# Patient Record
Sex: Male | Born: 1970 | Race: White | Hispanic: No | Marital: Single | Smoking: Current every day smoker
Health system: Southern US, Community
[De-identification: ages and names within clinical notes are randomized; demographics above are authoritative.]

## PROBLEM LIST (undated history)

## (undated) DIAGNOSIS — N200 Calculus of kidney: Secondary | ICD-10-CM

## (undated) DIAGNOSIS — M549 Dorsalgia, unspecified: Secondary | ICD-10-CM

## (undated) HISTORY — DX: Dorsalgia, unspecified: M54.9

## (undated) HISTORY — DX: Calculus of kidney: N20.0

---

## 2007-04-26 ENCOUNTER — Emergency Department: Payer: Self-pay | Admitting: Emergency Medicine

## 2012-07-09 ENCOUNTER — Ambulatory Visit: Payer: Self-pay | Admitting: Family Medicine

## 2013-04-24 ENCOUNTER — Ambulatory Visit: Payer: Self-pay | Admitting: Family Medicine

## 2013-05-09 ENCOUNTER — Ambulatory Visit: Payer: Self-pay | Admitting: Family Medicine

## 2013-06-04 DIAGNOSIS — D239 Other benign neoplasm of skin, unspecified: Secondary | ICD-10-CM

## 2013-06-04 HISTORY — DX: Other benign neoplasm of skin, unspecified: D23.9

## 2013-06-26 ENCOUNTER — Ambulatory Visit (INDEPENDENT_AMBULATORY_CARE_PROVIDER_SITE_OTHER): Payer: No Typology Code available for payment source | Admitting: General Surgery

## 2013-06-26 ENCOUNTER — Encounter: Payer: Self-pay | Admitting: General Surgery

## 2013-06-26 VITALS — BP 110/66 | HR 76 | Resp 12 | Ht 75.0 in | Wt 179.0 lb

## 2013-06-26 DIAGNOSIS — R220 Localized swelling, mass and lump, head: Secondary | ICD-10-CM

## 2013-06-26 DIAGNOSIS — R221 Localized swelling, mass and lump, neck: Secondary | ICD-10-CM

## 2013-06-26 NOTE — Progress Notes (Signed)
Patient ID: Paul Little, male   DOB: 1970/05/07, 43 y.o.   MRN: [id number]  Chief Complaint  Patient presents with  . Other    Cyst on neck    HPI Paul Little is a 43 y.o. male here today for an evaluation of a cyst on left posterior neck. Patient state the cyst has been there for about 20 years and is getting bigger. He states he had it drained about 5 months ago. The patient also reports that over the last 2 decades he has drain it himself with a razor. Rarely does he gets significant fluid or pus like material, usually only blood. HPI  Past Medical History  Diagnosis Date  . Back pain     No past surgical history on file.  No family history on file.  Social History History  Substance Use Topics  . Smoking status: Current Every Day Smoker -- 1.00 packs/day for 24 years    Types: Cigarettes  . Smokeless tobacco: Never Used  . Alcohol Use: Yes    No Known Allergies  Current Outpatient Prescriptions  Medication Sig Dispense Refill  . cyclobenzaprine (FLEXERIL) 10 MG tablet Take 1 tablet by mouth daily.      . nabumetone (RELAFEN) 500 MG tablet Take 1 tablet by mouth daily.       No current facility-administered medications for this visit.    Review of Systems Review of Systems  Constitutional: Negative.   Respiratory: Negative.   Cardiovascular: Negative.     Blood pressure 110/66, pulse 76, resp. rate 12, height 6\' 3"  (1.905 m), weight 179 lb (81.194 kg).  Physical Exam Physical Exam  Constitutional: He is oriented to person, place, and time. He appears well-developed and well-nourished.  Eyes: Conjunctivae are normal. No scleral icterus.  Neck: Neck supple.    Cardiovascular: Normal rate, regular rhythm and normal heart sounds.   Pulmonary/Chest: Effort normal and breath sounds normal.  Neurological: He is alert and oriented to person, place, and time.  Skin: Skin is warm and dry.       Data Reviewed Dermatology notes of Jun 04, 2013 were  reviewed.  Assessment    Chronic skin cyst left posterior neck.     Plan    The patient was amenable to excision. 10 cc of 0.5% Xylocaine with 0.25% Marcaine with 1-200,000 units of epinephrine was utilized well-tolerated. Chlor prep was applied to the skin. The ureter was excised through elliptical incision. Dense chronically inflamed tissue was noted. No purulence.  The skin defect was closed with interrupted 3-0 Vicryl subcuticular sutures. A dry dressing with Telfa and Tegaderm was applied. The procedure was well-tolerated.  He will return in one week for wound evaluation by the nursing staff.     REf. Dr. Arrie Aran. Meryl Crutch Chera Slivka 06/27/2013, 9:33 PM

## 2013-06-26 NOTE — Patient Instructions (Addendum)
Patient to return one week

## 2013-06-27 ENCOUNTER — Ambulatory Visit (INDEPENDENT_AMBULATORY_CARE_PROVIDER_SITE_OTHER): Payer: No Typology Code available for payment source | Admitting: *Deleted

## 2013-06-27 DIAGNOSIS — R221 Localized swelling, mass and lump, neck: Secondary | ICD-10-CM

## 2013-06-27 DIAGNOSIS — R220 Localized swelling, mass and lump, head: Secondary | ICD-10-CM

## 2013-06-27 NOTE — Progress Notes (Signed)
Patient came in today and saw a spot of blood on his dressing and wanted his bandage changed. Removed old bandage and applied an new one. No signs of infection noted.

## 2013-06-30 LAB — PATHOLOGY

## 2013-07-03 ENCOUNTER — Ambulatory Visit (INDEPENDENT_AMBULATORY_CARE_PROVIDER_SITE_OTHER): Payer: No Typology Code available for payment source | Admitting: *Deleted

## 2013-07-03 DIAGNOSIS — R221 Localized swelling, mass and lump, neck: Secondary | ICD-10-CM

## 2013-07-03 DIAGNOSIS — R220 Localized swelling, mass and lump, head: Secondary | ICD-10-CM

## 2013-07-03 NOTE — Patient Instructions (Signed)
The patient is aware to call back for any questions or concerns.  

## 2013-07-03 NOTE — Progress Notes (Signed)
Patient came in today for a wound check.  The left neck wound is clean, with no signs of infection noted. Aware benign pathology. Follow up as needed.

## 2017-11-26 ENCOUNTER — Encounter: Payer: Self-pay | Admitting: Emergency Medicine

## 2017-11-26 ENCOUNTER — Ambulatory Visit (INDEPENDENT_AMBULATORY_CARE_PROVIDER_SITE_OTHER): Payer: Worker's Compensation

## 2017-11-26 ENCOUNTER — Other Ambulatory Visit: Payer: Self-pay

## 2017-11-26 ENCOUNTER — Ambulatory Visit
Admission: EM | Admit: 2017-11-26 | Discharge: 2017-11-26 | Disposition: A | Discharge status: Home / Self Care | Payer: Worker's Compensation | Attending: Family Medicine | Admitting: Family Medicine

## 2017-11-26 DIAGNOSIS — S99922A Unspecified injury of left foot, initial encounter: Secondary | ICD-10-CM

## 2017-11-26 DIAGNOSIS — W230XXA Caught, crushed, jammed, or pinched between moving objects, initial encounter: Secondary | ICD-10-CM

## 2017-11-26 DIAGNOSIS — S99921A Unspecified injury of right foot, initial encounter: Secondary | ICD-10-CM

## 2017-11-26 MED ORDER — TRAMADOL HCL 50 MG PO TABS: 50.0000 mg | ORAL_TABLET | Freq: Three times a day (TID) | ORAL | 0 refills | Status: DC | PRN

## 2017-11-26 NOTE — Discharge Instructions (Signed)
Rest, ice, elevation.  Medication as needed.  Take care  Dr. Taneasha Fuqua  
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
## 2017-11-26 NOTE — ED Notes (Signed)
Obtained UDS without issue. [facility]

## 2018-12-11 ENCOUNTER — Ambulatory Visit
Admission: EM | Admit: 2018-12-11 | Discharge: 2018-12-11 | Disposition: A | Discharge status: Home / Self Care | Payer: 59

## 2018-12-11 ENCOUNTER — Encounter: Payer: Self-pay | Admitting: Emergency Medicine

## 2018-12-11 ENCOUNTER — Other Ambulatory Visit: Payer: Self-pay

## 2018-12-11 DIAGNOSIS — M79671 Pain in right foot: Secondary | ICD-10-CM | POA: Diagnosis not present

## 2018-12-11 DIAGNOSIS — M79672 Pain in left foot: Secondary | ICD-10-CM | POA: Diagnosis not present

## 2018-12-11 MED ORDER — MELOXICAM 15 MG PO TABS: 15.0000 mg | ORAL_TABLET | Freq: Every day | ORAL | 0 refills | Status: AC
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
## 2018-12-11 NOTE — Discharge Instructions (Signed)
Take meds as directed. Apply ACE wrap to both feet daily. Follow-up with ortho.

## 2019-06-29 IMAGING — CR DG FOOT COMPLETE 3+V*R*
4 series · 4 of 4 positions shown · non-contrast
Comparison: None.

CLINICAL DATA: Initial evaluation for acute pain status post crush
injury.

EXAM:
RIGHT FOOT COMPLETE - 3+ VIEW

[foot ap]
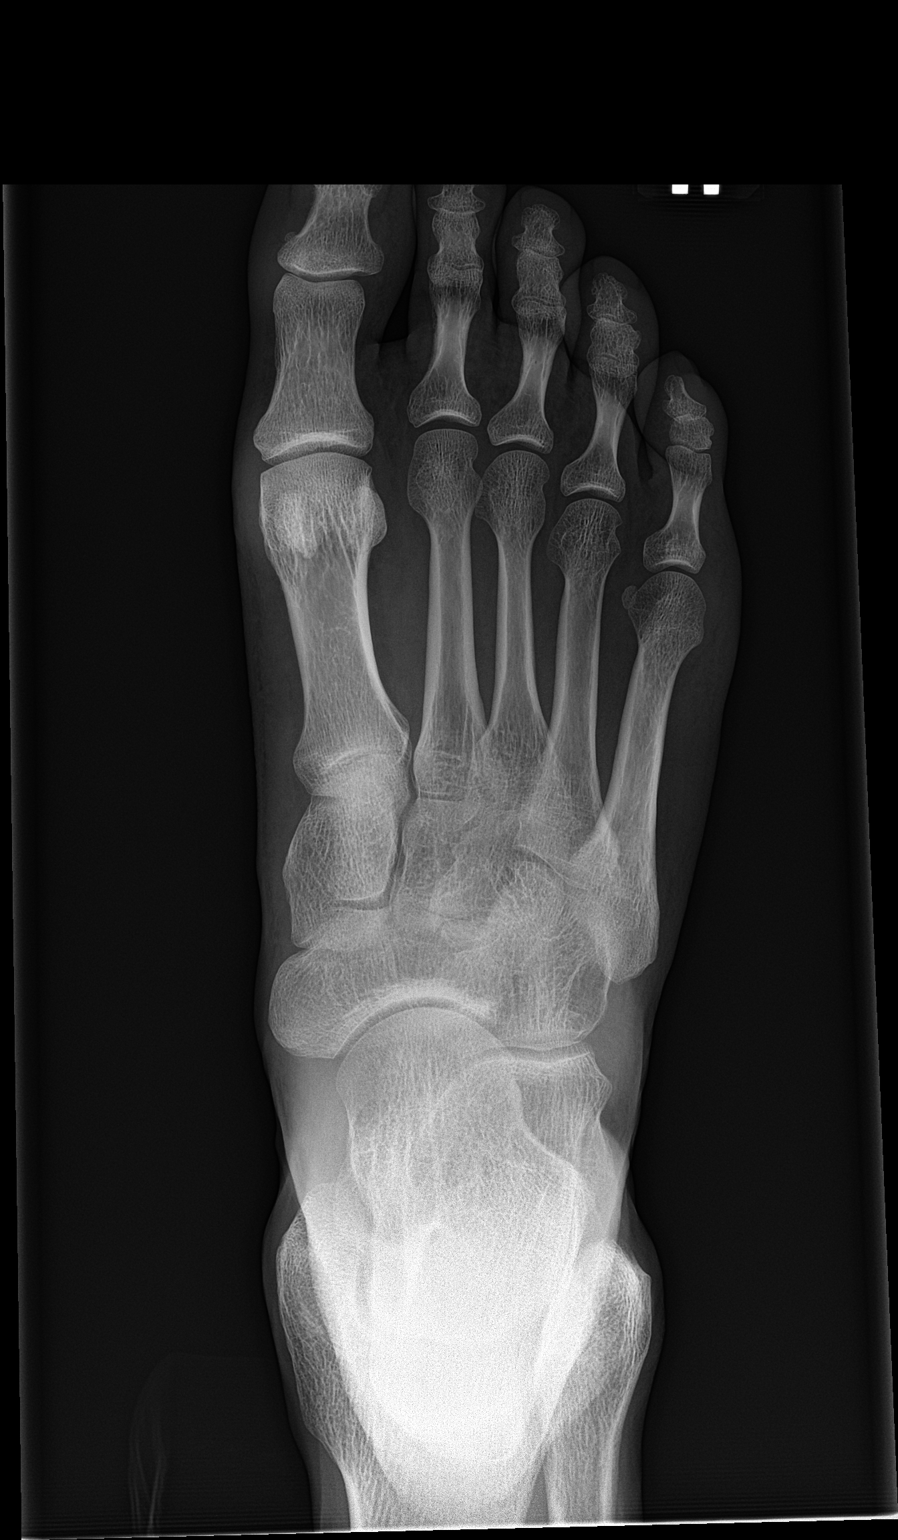

[foot obl]
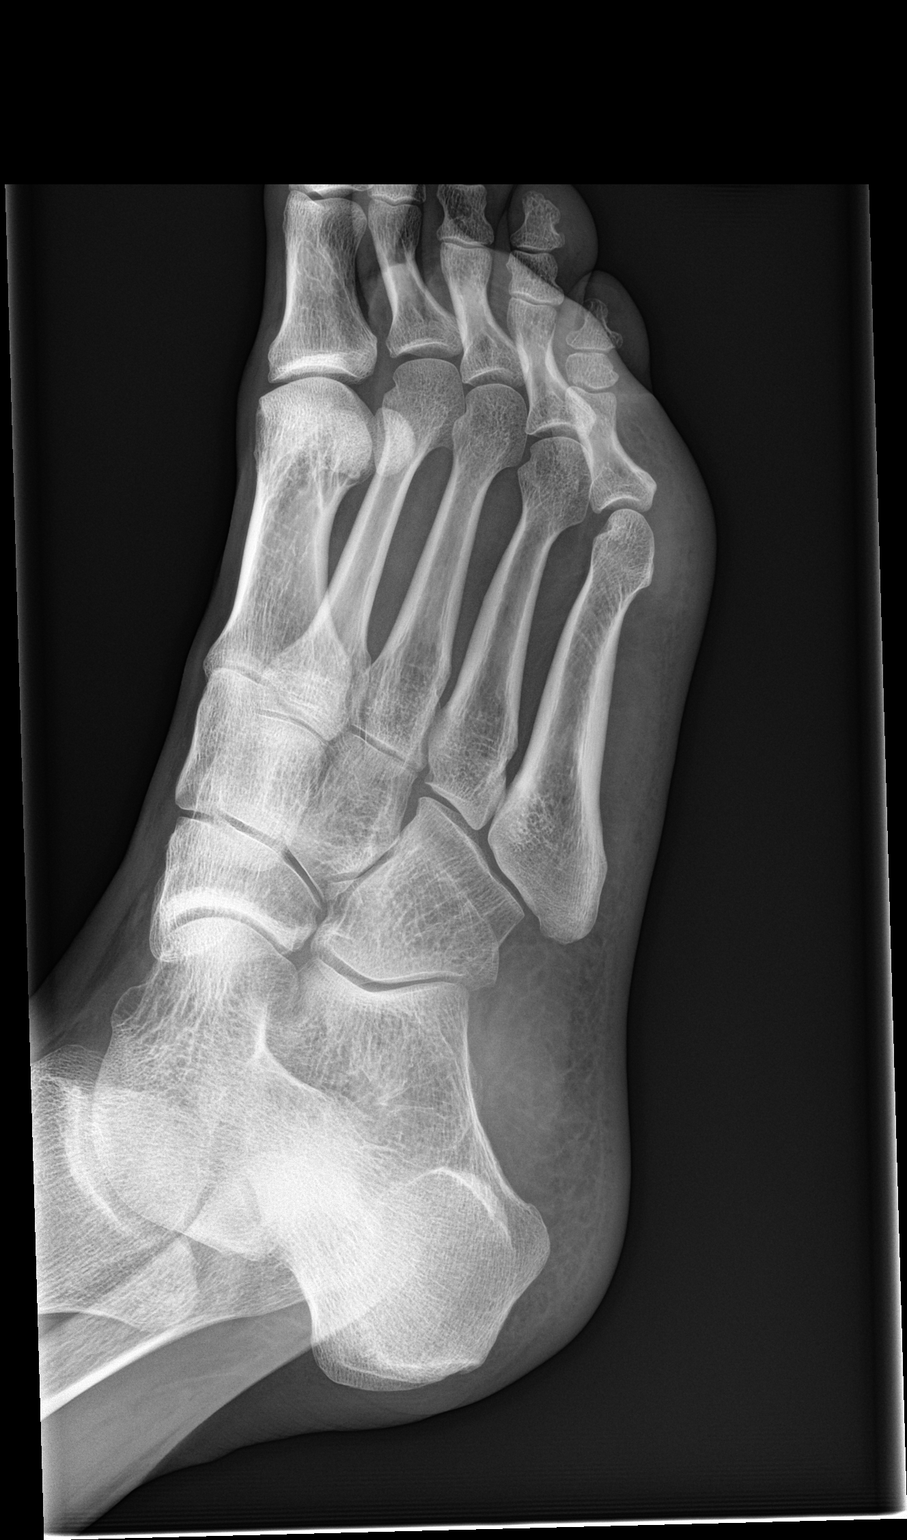

[foot lat (1 of 2)]
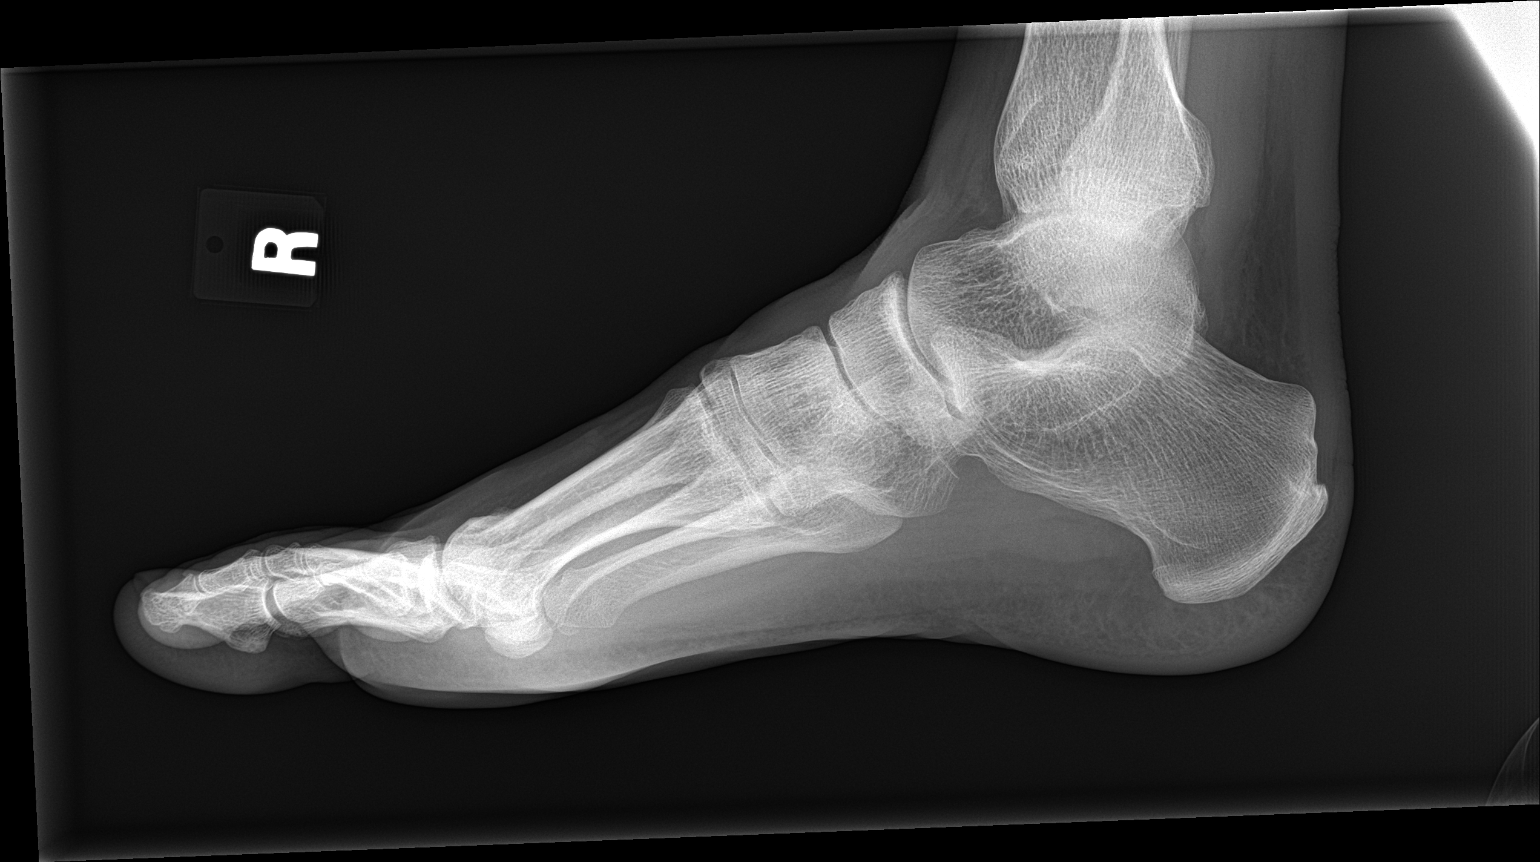

[foot lat (2 of 2)]
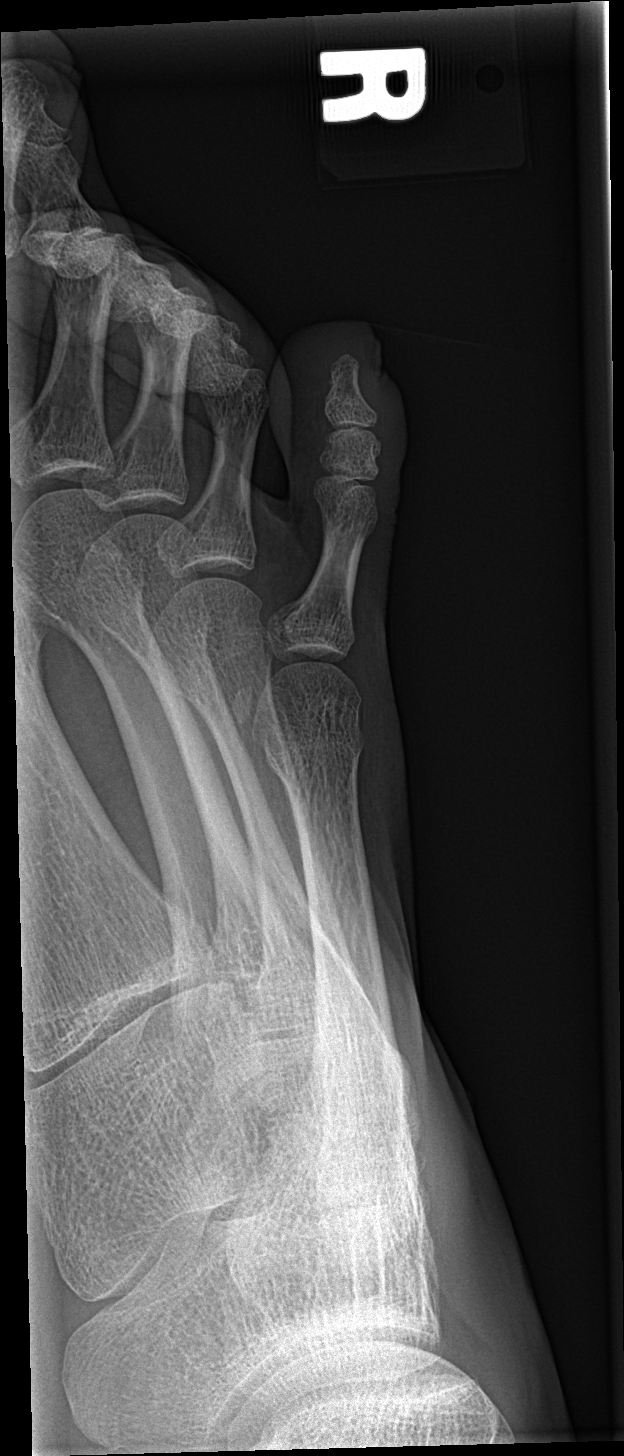

[4 of 4 positions shown; findings below may reference images not displayed]

FINDINGS: There is no evidence of fracture or dislocation. There is no
evidence of arthropathy or other focal bone abnormality. Soft
tissues are unremarkable.
IMPRESSION: No acute osseous abnormality about the right foot.

## 2019-09-05 ENCOUNTER — Ambulatory Visit: Payer: Self-pay | Admitting: Family Medicine

## 2019-10-24 ENCOUNTER — Ambulatory Visit: Payer: Self-pay | Admitting: Family Medicine

## 2019-10-28 ENCOUNTER — Encounter: Payer: Self-pay | Admitting: Family Medicine

## 2019-10-28 ENCOUNTER — Ambulatory Visit (INDEPENDENT_AMBULATORY_CARE_PROVIDER_SITE_OTHER): Payer: BC Managed Care – PPO | Admitting: Family Medicine

## 2019-10-28 ENCOUNTER — Other Ambulatory Visit: Payer: Self-pay

## 2019-10-28 VITALS — BP 100/65 | HR 77 | Temp 98.2°F | Resp 16 | Ht 76.0 in | Wt 187.0 lb

## 2019-10-28 DIAGNOSIS — R1013 Epigastric pain: Secondary | ICD-10-CM | POA: Diagnosis not present

## 2019-10-28 DIAGNOSIS — R1084 Generalized abdominal pain: Secondary | ICD-10-CM

## 2019-10-28 DIAGNOSIS — R5383 Other fatigue: Secondary | ICD-10-CM | POA: Diagnosis not present

## 2019-10-28 MED ORDER — OMEPRAZOLE 40 MG PO CPDR: 40.0000 mg | DELAYED_RELEASE_CAPSULE | Freq: Every day | ORAL | 1 refills | Status: DC

## 2019-10-28 MED ORDER — SUCRALFATE 1 G PO TABS: 1.0000 g | ORAL_TABLET | Freq: Three times a day (TID) | ORAL | 1 refills | Status: DC
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
## 2019-10-28 NOTE — Patient Instructions (Addendum)
Thank you for coming to the office today.  Blood work today stay tuned for results, may show cause of your symptoms  Stay tuned for apt from [facility]) [address], [place] [address] Phone: [phone]  I am not sure the exact cause of your abdominal pain, however I am concerned that one significant possibility could be uncontrolled Acid Reflux (GERD) and may have developed an Ulcer (Peptic Ulcer of stomach). May be from anti inflammatory motrin  Starting tomorrow before breakfast take Omeprazole rx 40mg  daily. Prefer to take this med about 30 min before breakfast or 1st meal of day for 4 weeks, don't stop taking unless we discuss first. Probably will need for about 8 weeks total if this ends up being an Ulcer.  Take other prescribed medicine Carafate (Sucralfate) as needed up to 4 times daily (3 meals and bedtime)  to coat stomach lining to ease symptoms, if it helps reduce symptoms then it is more likely to be due to acid and/or ulcer.  DIET RECOMMENDATIONS - Avoid spicy, greasy, fried foods, also things like caffeine, dark chocolate, peppermint can worsen - Avoid large meals and late night snacks, also do not go more than 4-5 hours without a snack or meal (not eating will worsen reflux symptoms due to stomach acid) - You may also elevate the head of your bed at night to sleep at very slight incline to help reduce symptoms  If the problem improves but keeps coming back, we can discuss higher dose or longer course at next visit.  If symptoms are worsening or persistent despite treatment or develop any different severe esophagus or abdominal pain, unable to swallow solids or liquids, nausea, vomiting especially blood in vomit, fever/chills, or unintentional weight loss / no appetite, please follow-up sooner in office or seek more immediate medical attention at hospital Emergency Department.  Regarding other  medicines:  - STOP taking Ibuprofen, Advil, Motrin, Goody's / BC powder - DO NOT take without discussing with your doctor. These medicines can put you at high risk for future bleeding.  If need pain medicine, may take Tylenol Extra Strength (Acetaminophen) 500mg  tabs - take 1 to 2 tabs per dose (max 1000mg ) every 6-8 hours for pain (take regularly, don't skip a dose for next 7 days), max 24 hour daily dose is 6 tablets or 3000mg . In the future you can repeat the same everyday Tylenol course for 1-2 weeks at a time.   Please schedule a Follow-up Appointment to: Return in about 6 weeks (around 12/09/2019) for 4-6 week follow-up abdominal pain / GI follow-up.  If you have any other questions or concerns, please feel free to call the office or send a message through [facility]. You may also schedule an earlier appointment if necessary.  Additionally, you may be receiving a survey about your experience at our office within a few days to 1 week by e-mail or mail. We value your feedback.  Nobie Putnam, DO [facility]

## 2019-10-29 ENCOUNTER — Encounter: Payer: Self-pay | Admitting: Family Medicine

## 2019-10-29 LAB — COMPLETE METABOLIC PANEL WITH GFR
AG Ratio: 1.7 (calc) (ref 1.0–2.5)
ALT: 13 U/L (ref 9–46)
AST: 11 U/L (ref 10–40)
Albumin: 4.4 g/dL (ref 3.6–5.1)
Alkaline phosphatase (APISO): 84 U/L (ref 36–130)
BUN: 10 mg/dL (ref 7–25)
CO2: 27 mmol/L (ref 20–32)
Calcium: 9.3 mg/dL (ref 8.6–10.3)
Chloride: 106 mmol/L (ref 98–110)
Creat: 0.91 mg/dL (ref 0.60–1.35)
GFR, Est African American: 115 mL/min/{1.73_m2} (ref >=60)
GFR, Est Non African American: 99 mL/min/{1.73_m2} (ref >=60)
Globulin: 2.6 g/dL (calc) (ref 1.9–3.7)
Glucose, Bld: 94 mg/dL (ref 65–99)
Potassium: 3.9 mmol/L (ref 3.5–5.3)
Sodium: 139 mmol/L (ref 135–146)
Total Bilirubin: 0.6 mg/dL (ref 0.2–1.2)
Total Protein: 7 g/dL (ref 6.1–8.1)

## 2019-10-29 LAB — CBC WITH DIFFERENTIAL/PLATELET
Absolute Monocytes: 764 cells/uL (ref 200–950)
Basophils Absolute: 92 cells/uL (ref 0–200)
Basophils Relative: 1 %
Eosinophils Absolute: 304 cells/uL (ref 15–500)
Eosinophils Relative: 3.3 %
HCT: 46.5 % (ref 38.5–50.0)
Hemoglobin: 15.8 g/dL (ref 13.2–17.1)
Lymphs Abs: 4131 cells/uL — ABNORMAL HIGH (ref 850–3900)
MCH: 28.3 pg (ref 27.0–33.0)
MCHC: 34 g/dL (ref 32.0–36.0)
MCV: 83.2 fL (ref 80.0–100.0)
MPV: 11 fL (ref 7.5–12.5)
Monocytes Relative: 8.3 %
Neutro Abs: 3910 cells/uL (ref 1500–7800)
Neutrophils Relative %: 42.5 %
Platelets: 303 10*3/uL (ref 140–400)
RBC: 5.59 10*6/uL (ref 4.20–5.80)
RDW: 13.1 % (ref 11.0–15.0)
Total Lymphocyte: 44.9 %
WBC: 9.2 10*3/uL (ref 3.8–10.8)

## 2019-11-06 ENCOUNTER — Other Ambulatory Visit: Payer: Self-pay | Admitting: Family Medicine

## 2019-11-06 DIAGNOSIS — R1013 Epigastric pain: Secondary | ICD-10-CM

## 2019-11-06 DIAGNOSIS — R1084 Generalized abdominal pain: Secondary | ICD-10-CM

## 2019-12-09 ENCOUNTER — Ambulatory Visit: Payer: Medicaid Other | Admitting: Family Medicine

## 2019-12-15 ENCOUNTER — Ambulatory Visit: Payer: 59 | Admitting: Gastroenterology

## 2020-09-02 ENCOUNTER — Telehealth: Payer: Self-pay

## 2020-09-02 NOTE — Telephone Encounter (Signed)
Opened in error

## 2021-07-11 DIAGNOSIS — H5213 Myopia, bilateral: Secondary | ICD-10-CM | POA: Diagnosis not present

## 2021-10-07 NOTE — Progress Notes (Unsigned)
There were no vitals taken for this visit.   Subjective:    Patient ID: Paul Little, male    DOB: [birth year], 51 y.o.   MRN: [id number]  HPI: Paul Little is a 51 y.o. male  No chief complaint on file.  Patient presents to clinic to establish care with new PCP.  Introduced to Designer, jewellery role and practice setting.  All questions answered.  Discussed provider/patient relationship and expectations.  Patient reports a history of ***. Patient denies a history of: Hypertension, Elevated Cholesterol, Diabetes, Thyroid problems, Depression, Anxiety, Neurological problems, and Abdominal problems.   Active Ambulatory Problems    Diagnosis Date Noted   Neck mass 06/27/2013   Resolved Ambulatory Problems    Diagnosis Date Noted   No Resolved Ambulatory Problems   Past Medical History:  Diagnosis Date   Back pain    Kidney stone    No past surgical history on file. Family History  Problem Relation Age of Onset   Kidney cancer Mother    Basal cell carcinoma Father    Hyperlipidemia Father      Review of Systems  Per HPI unless specifically indicated above     Objective:    There were no vitals taken for this visit.  Wt Readings from Last 3 Encounters:  10/28/19 187 lb (84.8 kg)  11/26/17 183 lb (83 kg)  06/26/13 179 lb (81.2 kg)    Physical Exam  Results for orders placed or performed in visit on 10/28/19  COMPLETE METABOLIC PANEL WITH GFR  Result Value Ref Range   Glucose, Bld 94 65 - 99 mg/dL   BUN 10 7 - 25 mg/dL   Creat 0.91 0.60 - 1.35 mg/dL   GFR, Est Non African American 99 > OR = 60 mL/min/1.77m   GFR, Est African American 115 > OR = 60 mL/min/1.731m  BUN/Creatinine Ratio NOT APPLICABLE 6 - 22 (calc)   Sodium 139 135 - 146 mmol/L   Potassium 3.9 3.5 - 5.3 mmol/L   Chloride 106 98 - 110 mmol/L   CO2 27 20 - 32 mmol/L   Calcium 9.3 8.6 - 10.3 mg/dL   Total Protein 7.0 6.1 - 8.1 g/dL   Albumin 4.4 3.6 - 5.1 g/dL   Globulin 2.6 1.9 - 3.7  g/dL (calc)   AG Ratio 1.7 1.0 - 2.5 (calc)   Total Bilirubin 0.6 0.2 - 1.2 mg/dL   Alkaline phosphatase (APISO) 84 36 - 130 U/L   AST 11 10 - 40 U/L   ALT 13 9 - 46 U/L  CBC with Differential/Platelet  Result Value Ref Range   WBC 9.2 3.8 - 10.8 Thousand/uL   RBC 5.59 4.20 - 5.80 Million/uL   Hemoglobin 15.8 13.2 - 17.1 g/dL   HCT 46.5 38.5 - 50.0 %   MCV 83.2 80.0 - 100.0 fL   MCH 28.3 27.0 - 33.0 pg   MCHC 34.0 32.0 - 36.0 g/dL   RDW 13.1 11.0 - 15.0 %   Platelets 303 140 - 400 Thousand/uL   MPV 11.0 7.5 - 12.5 fL   Neutro Abs 3,910 1,500 - 7,800 cells/uL   Lymphs Abs 4,131 (H) 850 - 3,900 cells/uL   Absolute Monocytes 764 200 - 950 cells/uL   Eosinophils Absolute 304 15 - 500 cells/uL   Basophils Absolute 92 0 - 200 cells/uL   Neutrophils Relative % 42.5 %   Total Lymphocyte 44.9 %   Monocytes Relative 8.3 %   Eosinophils Relative 3.3 %  Basophils Relative 1.0 %      Assessment & Plan:   Problem List Items Addressed This Visit   None Visit Diagnoses     Encounter to establish care    -  Primary        Follow up plan: No follow-ups on file.

## 2021-10-10 ENCOUNTER — Encounter: Payer: Self-pay | Admitting: Nurse Practitioner

## 2021-10-10 ENCOUNTER — Ambulatory Visit: Payer: Medicaid Other | Admitting: Nurse Practitioner

## 2021-10-10 VITALS — BP 128/91 | HR 90 | Temp 98.4°F | Ht 76.0 in | Wt 192.3 lb

## 2021-10-10 DIAGNOSIS — Z23 Encounter for immunization: Secondary | ICD-10-CM | POA: Diagnosis not present

## 2021-10-10 DIAGNOSIS — Z7689 Persons encountering health services in other specified circumstances: Secondary | ICD-10-CM

## 2021-10-10 DIAGNOSIS — D229 Melanocytic nevi, unspecified: Secondary | ICD-10-CM | POA: Diagnosis not present

## 2021-10-10 DIAGNOSIS — R6882 Decreased libido: Secondary | ICD-10-CM

## 2021-10-10 DIAGNOSIS — Z1331 Encounter for screening for depression: Secondary | ICD-10-CM

## 2021-10-10 DIAGNOSIS — H9313 Tinnitus, bilateral: Secondary | ICD-10-CM

## 2021-10-17 ENCOUNTER — Telehealth: Payer: Self-pay | Admitting: Nurse Practitioner

## 2021-10-17 DIAGNOSIS — H9313 Tinnitus, bilateral: Secondary | ICD-10-CM | POA: Diagnosis not present

## 2021-10-17 DIAGNOSIS — H903 Sensorineural hearing loss, bilateral: Secondary | ICD-10-CM | POA: Diagnosis not present

## 2021-10-17 NOTE — Telephone Encounter (Signed)
Pt had lab test done on 9/18 is anxious for the results, not seen, pls fu with pt re results @ [phone]

## 2021-10-18 LAB — TESTOSTERONE, FREE, TOTAL, SHBG
Sex Hormone Binding: 69.5 nmol/L (ref 19.3–76.4)
Testosterone, Free: 5.9 pg/mL — ABNORMAL LOW (ref 7.2–24.0)
Testosterone: 605 ng/dL (ref 264–916)

## 2021-10-18 NOTE — Progress Notes (Signed)
Referral placed.

## 2021-10-18 NOTE — Addendum Note (Signed)
Addended by: Jon Billings on: 10/18/2021 08:50 AM   Modules accepted: Orders

## 2021-10-18 NOTE — Progress Notes (Signed)
Please let patient know that his some of his free testosterone is slightly low but his total testosterone is within normal limits. If he would like to discuss testosterone replacement, I recommend he see Urology for this. I can place the referral if he would like.

## 2021-10-18 NOTE — Telephone Encounter (Signed)
Spoke with patient regarding lab results, no further questions or concerns.

## 2021-11-10 NOTE — Progress Notes (Signed)
BP 120/79   Pulse 86   Temp 98.4 F (36.9 C) (Oral)   Ht 6' 2.8" (1.9 m)   Wt 190 lb 9.6 oz (86.5 kg)   SpO2 98%   BMI 23.95 kg/m    Subjective:    Patient ID: Paul Little, male    DOB: 11-24-1970, 51 y.o.   MRN: [id number]  HPI: Paul Little is a 51 y.o. male presenting on 11/11/2021 for comprehensive medical examination. Current medical complaints include:none  He currently lives with: Interim Problems from his last visit: no  Depression Screen done today and results listed below:     10/10/2021    9:13 AM 10/28/2019    3:51 PM  Depression screen PHQ 2/9  Decreased Interest 2 0  Down, Depressed, Hopeless 2 0  PHQ - 2 Score 4 0  Altered sleeping 0   Tired, decreased energy 1   Change in appetite 1   Feeling bad or failure about yourself  2   Trouble concentrating 2   Moving slowly or fidgety/restless 0   Suicidal thoughts 0   PHQ-9 Score 10   Difficult doing work/chores Somewhat difficult     The patient does not have a history of falls. I did complete a risk assessment for falls. A plan of care for falls was documented.   Past Medical History:  Past Medical History:  Diagnosis Date   Back pain    Kidney stone     Surgical History:  History reviewed. No pertinent surgical history.  Medications:  No current outpatient medications on file prior to visit.   No current facility-administered medications on file prior to visit.    Allergies:  No Known Allergies  Social History:  Social History   Socioeconomic History   Marital status: Single    Spouse name: Not on file   Number of children: Not on file   Years of education: Not on file   Highest education level: Not on file  Occupational History   Not on file  Tobacco Use   Smoking status: Every Day    Packs/day: 1.00    Years: 29.00    Total pack years: 29.00    Types: Cigarettes   Smokeless tobacco: Never  Vaping Use   Vaping Use: Never used  Substance and Sexual Activity    Alcohol use: Yes    Comment: occassionally   Drug use: No   Sexual activity: Yes  Other Topics Concern   Not on file  Social History Narrative   Not on file   Social Determinants of Health   Financial Resource Strain: Not on file  Food Insecurity: Not on file  Transportation Needs: Not on file  Physical Activity: Not on file  Stress: Not on file  Social Connections: Not on file  Intimate Partner Violence: Not on file   Social History   Tobacco Use  Smoking Status Every Day   Packs/day: 1.00   Years: 29.00   Total pack years: 29.00   Types: Cigarettes  Smokeless Tobacco Never   Social History   Substance and Sexual Activity  Alcohol Use Yes   Comment: occassionally    Family History:  Family History  Problem Relation Age of Onset   Kidney cancer Mother    Basal cell carcinoma Father    Hyperlipidemia Father     Past medical history, surgical history, medications, allergies, family history and social history reviewed with patient today and changes made to appropriate areas of the  chart.   Review of Systems  All other systems reviewed and are negative.  All other ROS negative except what is listed above and in the HPI.      Objective:    BP 120/79   Pulse 86   Temp 98.4 F (36.9 C) (Oral)   Ht 6' 2.8" (1.9 m)   Wt 190 lb 9.6 oz (86.5 kg)   SpO2 98%   BMI 23.95 kg/m   Wt Readings from Last 3 Encounters:  11/11/21 190 lb 9.6 oz (86.5 kg)  10/10/21 192 lb 4.8 oz (87.2 kg)  10/28/19 187 lb (84.8 kg)    Physical Exam Vitals and nursing note reviewed.  Constitutional:      General: He is not in acute distress.    Appearance: Normal appearance. He is normal weight. He is not ill-appearing, toxic-appearing or diaphoretic.  HENT:     Head: Normocephalic.     Right Ear: Tympanic membrane, ear canal and external ear normal.     Left Ear: Tympanic membrane, ear canal and external ear normal.     Nose: Nose normal. No congestion or rhinorrhea.      Mouth/Throat:     Mouth: Mucous membranes are moist.  Eyes:     General:        Right eye: No discharge.        Left eye: No discharge.     Extraocular Movements: Extraocular movements intact.     Conjunctiva/sclera: Conjunctivae normal.     Pupils: Pupils are equal, round, and reactive to light.  Cardiovascular:     Rate and Rhythm: Normal rate and regular rhythm.     Heart sounds: No murmur heard. Pulmonary:     Effort: Pulmonary effort is normal. No respiratory distress.     Breath sounds: Normal breath sounds. No wheezing, rhonchi or rales.  Abdominal:     General: Abdomen is flat. Bowel sounds are normal. There is no distension.     Palpations: Abdomen is soft.     Tenderness: There is no abdominal tenderness. There is no guarding.  Musculoskeletal:     Cervical back: Normal range of motion and neck supple.  Skin:    General: Skin is warm and dry.     Capillary Refill: Capillary refill takes less than 2 seconds.  Neurological:     General: No focal deficit present.     Mental Status: He is alert and oriented to person, place, and time.     Cranial Nerves: No cranial nerve deficit.     Motor: No weakness.     Deep Tendon Reflexes: Reflexes normal.  Psychiatric:        Mood and Affect: Mood normal.        Behavior: Behavior normal.        Thought Content: Thought content normal.        Judgment: Judgment normal.     Results for orders placed or performed in visit on 10/10/21  Testosterone, free, total([facility])  Result Value Ref Range   Testosterone 605 264 - 916 ng/dL   Testosterone, Free 5.9 (L) 7.2 - 24.0 pg/mL   Sex Hormone Binding 69.5 19.3 - 76.4 nmol/L      Assessment & Plan:   Problem List Items Addressed This Visit   None Visit Diagnoses     Annual physical exam    -  Primary   Health maintenance reviewed during visit today.  Labs ordered. TDAP up to date.  Declined Colonoscopy. Declined pneumonia  vaccine.   Relevant Orders   TSH   PSA    Lipid panel   CBC with Differential/Platelet   Comprehensive metabolic panel   Urinalysis, Routine w reflex microscopic   Hepatitis C Antibody   HIV Antibody (routine testing w rflx)   Positive depression screening       Feels like it is everyday life situations.  Declines needing medication.   Relevant Orders   HIV Antibody (routine testing w rflx)   Current every day smoker       Low dose CT Lung ordered. Declined pneumonia shot.   Relevant Orders   CT CHEST LUNG CA SCREEN LOW DOSE W/O CM   Screening for ischemic heart disease       Relevant Orders   Lipid panel   Screening for HIV (human immunodeficiency virus)       Encounter for hepatitis C screening test for low risk patient       Relevant Orders   Hepatitis C Antibody        Discussed aspirin prophylaxis for myocardial infarction prevention and decision was it was not indicated  LABORATORY TESTING:  Health maintenance labs ordered today as discussed above.   The natural history of prostate cancer and ongoing controversy regarding screening and potential treatment outcomes of prostate cancer has been discussed with the patient. The meaning of a false positive PSA and a false negative PSA has been discussed. He indicates understanding of the limitations of this screening test and wishes to proceed with screening PSA testing.   IMMUNIZATIONS:   - Tdap: Tetanus vaccination status reviewed: last tetanus booster within 10 years. - Influenza: Refused - Pneumovax: Refused - Prevnar: Not applicable - COVID: Not applicable - HPV: Not applicable - Shingrix vaccine: Refused  SCREENING: - Colonoscopy: Refused  Discussed with patient purpose of the colonoscopy is to detect colon cancer at curable precancerous or early stages   - AAA Screening: Not applicable  -Hearing Test: Not applicable  -Spirometry: Not applicable   PATIENT COUNSELING:    Sexuality: Discussed sexually transmitted diseases, partner selection, use of  condoms, avoidance of unintended pregnancy  and contraceptive alternatives.   Advised to avoid cigarette smoking.  I discussed with the patient that most people either abstain from alcohol or drink within safe limits (<=14/week and <=4 drinks/occasion for males, <=7/weeks and <= 3 drinks/occasion for females) and that the risk for alcohol disorders and other health effects rises proportionally with the number of drinks per week and how often a drinker exceeds daily limits.  Discussed cessation/primary prevention of drug use and availability of treatment for abuse.   Diet: Encouraged to adjust caloric intake to maintain  or achieve ideal body weight, to reduce intake of dietary saturated fat and total fat, to limit sodium intake by avoiding high sodium foods and not adding table salt, and to maintain adequate dietary potassium and calcium preferably from fresh fruits, vegetables, and low-fat dairy products.    stressed the importance of regular exercise  Injury prevention: Discussed safety belts, safety helmets, smoke detector, smoking near bedding or upholstery.   Dental health: Discussed importance of regular tooth brushing, flossing, and dental visits.   Follow up plan: NEXT PREVENTATIVE PHYSICAL DUE IN 1 YEAR. Return in about 1 year (around 11/12/2022) for Physical and Fasting labs.

## 2021-11-11 ENCOUNTER — Encounter: Payer: Self-pay | Admitting: Nurse Practitioner

## 2021-11-11 ENCOUNTER — Ambulatory Visit (INDEPENDENT_AMBULATORY_CARE_PROVIDER_SITE_OTHER): Payer: Medicaid Other | Admitting: Nurse Practitioner

## 2021-11-11 ENCOUNTER — Ambulatory Visit (INDEPENDENT_AMBULATORY_CARE_PROVIDER_SITE_OTHER): Payer: Medicaid Other | Admitting: Urology

## 2021-11-11 ENCOUNTER — Encounter: Payer: Self-pay | Admitting: Urology

## 2021-11-11 VITALS — BP 128/69 | HR 72 | Ht 74.0 in | Wt 190.0 lb

## 2021-11-11 VITALS — BP 120/79 | HR 86 | Temp 98.4°F | Ht 74.8 in | Wt 190.6 lb

## 2021-11-11 DIAGNOSIS — F17200 Nicotine dependence, unspecified, uncomplicated: Secondary | ICD-10-CM

## 2021-11-11 DIAGNOSIS — Z136 Encounter for screening for cardiovascular disorders: Secondary | ICD-10-CM

## 2021-11-11 DIAGNOSIS — Z0000 Encounter for general adult medical examination without abnormal findings: Secondary | ICD-10-CM | POA: Diagnosis not present

## 2021-11-11 DIAGNOSIS — Z1159 Encounter for screening for other viral diseases: Secondary | ICD-10-CM

## 2021-11-11 DIAGNOSIS — R7989 Other specified abnormal findings of blood chemistry: Secondary | ICD-10-CM

## 2021-11-11 DIAGNOSIS — Z1331 Encounter for screening for depression: Secondary | ICD-10-CM

## 2021-11-11 DIAGNOSIS — R5383 Other fatigue: Secondary | ICD-10-CM | POA: Diagnosis not present

## 2021-11-11 DIAGNOSIS — Z114 Encounter for screening for human immunodeficiency virus [HIV]: Secondary | ICD-10-CM

## 2021-11-11 DIAGNOSIS — R6882 Decreased libido: Secondary | ICD-10-CM | POA: Diagnosis not present

## 2021-11-11 LAB — URINALYSIS, ROUTINE W REFLEX MICROSCOPIC
Bilirubin, UA: NEGATIVE
Glucose, UA: NEGATIVE
Ketones, UA: NEGATIVE
Leukocytes,UA: NEGATIVE
Nitrite, UA: NEGATIVE
Protein,UA: NEGATIVE
Specific Gravity, UA: 1.02 (ref 1.005–1.030)
Urobilinogen, Ur: 0.2 mg/dL (ref 0.2–1.0)
pH, UA: 7 (ref 5.0–7.5)

## 2021-11-11 LAB — MICROSCOPIC EXAMINATION
Bacteria, UA: NONE SEEN
Epithelial Cells (non renal): NONE SEEN /hpf (ref 0–10)
WBC, UA: NONE SEEN /hpf (ref 0–5)

## 2021-11-11 NOTE — Progress Notes (Unsigned)
   11/11/2021 1:14 PM   Paul Little Aug 21, 1970 [id number]  Referring provider: Jon Billings, NP [address] [place],  [place] [address]  Chief Complaint  Patient presents with   Other    HPI: ELIYOHU CLASS is a 51 y.o. male referred for evaluation of low libido.  PCP visit 10/10/2021 with complaints of fatigue and low libido A total testosterone level was normal at 605; free testosterone level was low at 5.9 pg/mL No bothersome LUTS Denies ED   PMH: Past Medical History:  Diagnosis Date   Back pain    Kidney stone     Surgical History: No past surgical history on file.  Home Medications:  Allergies as of 11/11/2021   No Known Allergies      Medication List    as of November 11, 2021  1:14 PM   You have not been prescribed any medications.     Allergies: No Known Allergies  Family History: Family History  Problem Relation Age of Onset   Kidney cancer Mother    Basal cell carcinoma Father    Hyperlipidemia Father     Social History:  reports that he has been smoking cigarettes. He has a 29.00 pack-year smoking history. He has never used smokeless tobacco. He reports current alcohol use. He reports that he does not use drugs.   Physical Exam: BP 128/69   Pulse 72   Ht '6\' 2"'$  (1.88 m)   Wt 190 lb (86.2 kg)   BMI 24.39 kg/m   Constitutional:  Alert and oriented, No acute distress. HEENT: [place] AT, moist mucus membranes.  Trachea midline, no masses. Cardiovascular: No clubbing, cyanosis, or edema. Respiratory: Normal respiratory effort, no increased work of breathing. GI: Abdomen is soft, nontender, nondistended, no abdominal masses GU: Phallus without lesions.  Testes descended bilaterally without masses or tenderness and volume >20 cc bilaterally Skin: No rashes, bruises or suspicious lesions. Neurologic: Grossly intact, no focal deficits, moving all 4 extremities. Psychiatric: Normal mood and affect.   Assessment & Plan:   51 y.o. male  with low energy and libido.  Total testosterone level in normal range however free testosterone level is low Schedule repeat a.m. free/total testosterone and LH We discussed the diagnosis of testosterone is based on 2 abnormal total testosterone levels drawn in the a.m. admitted with signs and symptoms of low testosterone. We discussed various forms of testosterone placement including topical preparations, intramuscular injections, subcutaneous injections, subcutaneous pellet implantation and oral testosterone.  Pros and cons of each form were discussed.  The risk of transference of topical testosterone. I had an extensive discussion regarding testosterone replacement therapy including the following: Treatment may result in improvements in erectile function, low sex drive, anemia, bone mineral density, lean body mass, and depressive symptoms; evidence is inconclusive whether testosterone therapy improves cognitive function, measures of diabetes, energy, fatigue, lipid profiles, and quality of life measures; there is no conclusive evidence linking testosterone therapy to the development of prostate cancer; there is no definitive evidence linking testosterone therapy to a higher incidence of venothrombolic events; at the present time it cannot be stated definitively whether testosterone therapy increases or decreases the risk of cardiovascular events including myocardial infarction and stroke. Potential side effects were discussed including erythrocytosis, gynecomastia.  The need for regular monitoring of testosterone levels and hematocrit was discussed.    Abbie Sons, [place] [address], [place], [place] [address] [phone]

## 2021-11-12 LAB — CBC WITH DIFFERENTIAL/PLATELET
Basophils Absolute: 0.1 10*3/uL (ref 0.0–0.2)
Basos: 1 %
EOS (ABSOLUTE): 0.2 10*3/uL (ref 0.0–0.4)
Eos: 2 %
Hematocrit: 50.9 % (ref 37.5–51.0)
Hemoglobin: 17 g/dL (ref 13.0–17.7)
Immature Grans (Abs): 0 10*3/uL (ref 0.0–0.1)
Immature Granulocytes: 0 %
Lymphocytes Absolute: 3.3 10*3/uL — ABNORMAL HIGH (ref 0.7–3.1)
Lymphs: 35 %
MCH: 28.2 pg (ref 26.6–33.0)
MCHC: 33.4 g/dL (ref 31.5–35.7)
MCV: 85 fL (ref 79–97)
Monocytes Absolute: 0.8 10*3/uL (ref 0.1–0.9)
Monocytes: 8 %
Neutrophils Absolute: 5.2 10*3/uL (ref 1.4–7.0)
Neutrophils: 54 %
Platelets: 308 10*3/uL (ref 150–450)
RBC: 6.02 x10E6/uL — ABNORMAL HIGH (ref 4.14–5.80)
RDW: 12.7 % (ref 11.6–15.4)
WBC: 9.6 10*3/uL (ref 3.4–10.8)

## 2021-11-12 LAB — COMPREHENSIVE METABOLIC PANEL
ALT: 18 IU/L (ref 0–44)
AST: 15 IU/L (ref 0–40)
Albumin/Globulin Ratio: 1.6 (ref 1.2–2.2)
Albumin: 4.4 g/dL (ref 4.1–5.1)
Alkaline Phosphatase: 111 IU/L (ref 44–121)
BUN/Creatinine Ratio: 9 (ref 9–20)
BUN: 9 mg/dL (ref 6–24)
Bilirubin Total: 0.6 mg/dL (ref 0.0–1.2)
CO2: 23 mmol/L (ref 20–29)
Calcium: 9.8 mg/dL (ref 8.7–10.2)
Chloride: 101 mmol/L (ref 96–106)
Creatinine, Ser: 1.01 mg/dL (ref 0.76–1.27)
Globulin, Total: 2.7 g/dL (ref 1.5–4.5)
Glucose: 89 mg/dL (ref 70–99)
Potassium: 4.4 mmol/L (ref 3.5–5.2)
Sodium: 140 mmol/L (ref 134–144)
Total Protein: 7.1 g/dL (ref 6.0–8.5)
eGFR: 91 mL/min/{1.73_m2} (ref >59)

## 2021-11-12 LAB — LIPID PANEL
Chol/HDL Ratio: 6.6 ratio — ABNORMAL HIGH (ref 0.0–5.0)
Cholesterol, Total: 205 mg/dL — ABNORMAL HIGH (ref 100–199)
HDL: 31 mg/dL — ABNORMAL LOW (ref >39)
LDL Chol Calc (NIH): 145 mg/dL — ABNORMAL HIGH (ref 0–99)
Triglycerides: 159 mg/dL — ABNORMAL HIGH (ref 0–149)
VLDL Cholesterol Cal: 29 mg/dL (ref 5–40)

## 2021-11-12 LAB — PSA: Prostate Specific Ag, Serum: 1.2 ng/mL (ref 0.0–4.0)

## 2021-11-12 LAB — TSH: TSH: 2.4 u[IU]/mL (ref 0.450–4.500)

## 2021-11-12 LAB — HIV ANTIBODY (ROUTINE TESTING W REFLEX): HIV Screen 4th Generation wRfx: NONREACTIVE

## 2021-11-12 LAB — HEPATITIS C ANTIBODY: Hep C Virus Ab: NONREACTIVE

## 2021-11-14 ENCOUNTER — Encounter: Payer: Self-pay | Admitting: Urology

## 2021-11-14 ENCOUNTER — Telehealth: Payer: Self-pay | Admitting: *Deleted

## 2021-11-14 NOTE — Progress Notes (Signed)
Please let patient know that his cholesterol is elevated.  His cardiac risk score puts him at high risk of having a stroke or heart attack over the next 10 years.  I recommend that he start a statin called crestor '5mg'$  daily.  The goal will be to increase this to '20mg'$  daily if patient tolerates it well.  If he agrees to the medication I can send it to the pharmacy.  If he agrees to the medication change we should follow up in 6 months to evaluate his cholesterol again.  Please make him a follow up.    Other lab work looks good.  Liver, kidneys, electrolytes look good.    The 10-year ASCVD risk score (Arnett DK, et al., 2019) is: 12.9%   Values used to calculate the score:     Age: 51 years     Sex: Male     Is Non-Hispanic African American: No     Diabetic: No     Tobacco smoker: Yes     Systolic Blood Pressure: 638 mmHg     Is BP treated: No     HDL Cholesterol: 31 mg/dL     Total Cholesterol: 205 mg/dL

## 2021-11-14 NOTE — Telephone Encounter (Signed)
Noted  

## 2021-11-14 NOTE — Telephone Encounter (Signed)
Patient called back , reports he was talking to someone regarding lab results and was "cut off". Reviewed results from K. Mathis Dad, NP from 11/14/21 to let patient know cholesterol is elevated. His cardiac risk score puts him at high risk of having a stroke or heart attack over the next 10 years. I recommend that he start a statin called crestor 5 mg daily. The goal will be to increase this to 20 mg daily if patent tolerate it well. If he agrees to the medication can send it to the pharmacy. Patient reports he would like to try to change diet before beginning medication. 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 Please advise.

## 2021-11-14 NOTE — Telephone Encounter (Signed)
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

## 2021-11-14 NOTE — Telephone Encounter (Signed)
Spoke with patient, he would like to make diet changes first

## 2021-11-16 ENCOUNTER — Other Ambulatory Visit: Payer: Medicaid Other

## 2021-11-16 DIAGNOSIS — R7989 Other specified abnormal findings of blood chemistry: Secondary | ICD-10-CM | POA: Diagnosis not present

## 2021-11-20 LAB — LUTEINIZING HORMONE: LH: 4.3 m[IU]/mL (ref 1.7–8.6)

## 2021-11-20 LAB — TESTOSTERONE,FREE AND TOTAL
Testosterone, Free: 5 pg/mL — ABNORMAL LOW (ref 7.2–24.0)
Testosterone: 554 ng/dL (ref 264–916)

## 2021-11-28 ENCOUNTER — Encounter: Payer: Self-pay | Admitting: *Deleted

## 2021-11-28 NOTE — Telephone Encounter (Signed)
Talked with patient about his clomid. Patient states he don't want to pay out of pocket at this time. He will let us know if he would like to try cloimd .

## 2022-11-02 ENCOUNTER — Ambulatory Visit: Payer: PRIVATE HEALTH INSURANCE | Admitting: Dermatology

## 2022-11-12 NOTE — Progress Notes (Unsigned)
There were no vitals taken for this visit.   Subjective:    Patient ID: Paul Little, male    DOB: Sep 27, 1970, 52 y.o.   MRN: [id number]  HPI: Paul Little is a 52 y.o. male presenting on 11/13/2022 for comprehensive medical examination. Current medical complaints include:none  He currently lives with: Interim Problems from his last visit: no  Depression Screen done today and results listed below:     10/10/2021    9:13 AM 10/28/2019    3:51 PM  Depression screen PHQ 2/9  Decreased Interest 2 0  Down, Depressed, Hopeless 2 0  PHQ - 2 Score 4 0  Altered sleeping 0   Tired, decreased energy 1   Change in appetite 1   Feeling bad or failure about yourself  2   Trouble concentrating 2   Moving slowly or fidgety/restless 0   Suicidal thoughts 0   PHQ-9 Score 10   Difficult doing work/chores Somewhat difficult     The patient does not have a history of falls. I did complete a risk assessment for falls. A plan of care for falls was documented.   Past Medical History:  Past Medical History:  Diagnosis Date  . Back pain   . Kidney stone     Surgical History:  No past surgical history on file.  Medications:  No current outpatient medications on file prior to visit.   No current facility-administered medications on file prior to visit.    Allergies:  No Known Allergies  Social History:  Social History   Socioeconomic History  . Marital status: Single    Spouse name: Not on file  . Number of children: Not on file  . Years of education: Not on file  . Highest education level: Not on file  Occupational History  . Not on file  Tobacco Use  . Smoking status: Every Day    Current packs/day: 1.00    Average packs/day: 1 pack/day for 29.0 years (29.0 ttl pk-yrs)    Types: Cigarettes  . Smokeless tobacco: Never  Vaping Use  . Vaping status: Never Used  Substance and Sexual Activity  . Alcohol use: Yes    Comment: occassionally  . Drug use: No  . Sexual  activity: Yes  Other Topics Concern  . Not on file  Social History Narrative  . Not on file   Social Determinants of Health   Financial Resource Strain: Not on file  Food Insecurity: Not on file  Transportation Needs: Not on file  Physical Activity: Not on file  Stress: Not on file  Social Connections: Not on file  Intimate Partner Violence: Not on file   Social History   Tobacco Use  Smoking Status Every Day  . Current packs/day: 1.00  . Average packs/day: 1 pack/day for 29.0 years (29.0 ttl pk-yrs)  . Types: Cigarettes  Smokeless Tobacco Never   Social History   Substance and Sexual Activity  Alcohol Use Yes   Comment: occassionally    Family History:  Family History  Problem Relation Age of Onset  . Kidney cancer Mother   . Basal cell carcinoma Father   . Hyperlipidemia Father     Past medical history, surgical history, medications, allergies, family history and social history reviewed with patient today and changes made to appropriate areas of the chart.   Review of Systems  All other systems reviewed and are negative.  All other ROS negative except what is listed above and in the  HPI.      Objective:    There were no vitals taken for this visit.  Wt Readings from Last 3 Encounters:  11/11/21 190 lb (86.2 kg)  11/11/21 190 lb 9.6 oz (86.5 kg)  10/10/21 192 lb 4.8 oz (87.2 kg)    Physical Exam Vitals and nursing note reviewed.  Constitutional:      General: He is not in acute distress.    Appearance: Normal appearance. He is normal weight. He is not ill-appearing, toxic-appearing or diaphoretic.  HENT:     Head: Normocephalic.     Right Ear: Tympanic membrane, ear canal and external ear normal.     Left Ear: Tympanic membrane, ear canal and external ear normal.     Nose: Nose normal. No congestion or rhinorrhea.     Mouth/Throat:     Mouth: Mucous membranes are moist.  Eyes:     General:        Right eye: No discharge.        Left eye: No  discharge.     Extraocular Movements: Extraocular movements intact.     Conjunctiva/sclera: Conjunctivae normal.     Pupils: Pupils are equal, round, and reactive to light.  Cardiovascular:     Rate and Rhythm: Normal rate and regular rhythm.     Heart sounds: No murmur heard. Pulmonary:     Effort: Pulmonary effort is normal. No respiratory distress.     Breath sounds: Normal breath sounds. No wheezing, rhonchi or rales.  Abdominal:     General: Abdomen is flat. Bowel sounds are normal. There is no distension.     Palpations: Abdomen is soft.     Tenderness: There is no abdominal tenderness. There is no guarding.  Musculoskeletal:     Cervical back: Normal range of motion and neck supple.  Skin:    General: Skin is warm and dry.     Capillary Refill: Capillary refill takes less than 2 seconds.  Neurological:     General: No focal deficit present.     Mental Status: He is alert and oriented to person, place, and time.     Cranial Nerves: No cranial nerve deficit.     Motor: No weakness.     Deep Tendon Reflexes: Reflexes normal.  Psychiatric:        Mood and Affect: Mood normal.        Behavior: Behavior normal.        Thought Content: Thought content normal.        Judgment: Judgment normal.    Results for orders placed or performed in visit on 11/16/21  Luteinizing hormone  Result Value Ref Range   LH 4.3 1.7 - 8.6 mIU/mL  Testosterone,Free and Total  Result Value Ref Range   Testosterone 554 264 - 916 ng/dL   Testosterone, Free 5.0 (L) 7.2 - 24.0 pg/mL      Assessment & Plan:   Problem List Items Addressed This Visit   None Visit Diagnoses     Annual physical exam    -  Primary   Screening for ischemic heart disease            Discussed aspirin prophylaxis for myocardial infarction prevention and decision was it was not indicated  LABORATORY TESTING:  Health maintenance labs ordered today as discussed above.   The natural history of prostate cancer and  ongoing controversy regarding screening and potential treatment outcomes of prostate cancer has been discussed with the patient. The meaning of  a false positive PSA and a false negative PSA has been discussed. He indicates understanding of the limitations of this screening test and wishes to proceed with screening PSA testing.   IMMUNIZATIONS:   - Tdap: Tetanus vaccination status reviewed: last tetanus booster within 10 years. - Influenza: Refused - Pneumovax: Refused - Prevnar: Not applicable - COVID: Not applicable - HPV: Not applicable - Shingrix vaccine: Refused  SCREENING: - Colonoscopy: Refused  Discussed with patient purpose of the colonoscopy is to detect colon cancer at curable precancerous or early stages   - AAA Screening: Not applicable  -Hearing Test: Not applicable  -Spirometry: Not applicable   PATIENT COUNSELING:    Sexuality: Discussed sexually transmitted diseases, partner selection, use of condoms, avoidance of unintended pregnancy  and contraceptive alternatives.   Advised to avoid cigarette smoking.  I discussed with the patient that most people either abstain from alcohol or drink within safe limits (<=14/week and <=4 drinks/occasion for males, <=7/weeks and <= 3 drinks/occasion for females) and that the risk for alcohol disorders and other health effects rises proportionally with the number of drinks per week and how often a drinker exceeds daily limits.  Discussed cessation/primary prevention of drug use and availability of treatment for abuse.   Diet: Encouraged to adjust caloric intake to maintain  or achieve ideal body weight, to reduce intake of dietary saturated fat and total fat, to limit sodium intake by avoiding high sodium foods and not adding table salt, and to maintain adequate dietary potassium and calcium preferably from fresh fruits, vegetables, and low-fat dairy products.    stressed the importance of regular exercise  Injury prevention:  Discussed safety belts, safety helmets, smoke detector, smoking near bedding or upholstery.   Dental health: Discussed importance of regular tooth brushing, flossing, and dental visits.   Follow up plan: NEXT PREVENTATIVE PHYSICAL DUE IN 1 YEAR. No follow-ups on file.

## 2022-11-13 ENCOUNTER — Ambulatory Visit (INDEPENDENT_AMBULATORY_CARE_PROVIDER_SITE_OTHER): Payer: PRIVATE HEALTH INSURANCE | Admitting: Nurse Practitioner

## 2022-11-13 ENCOUNTER — Encounter: Payer: Self-pay | Admitting: Nurse Practitioner

## 2022-11-13 VITALS — BP 115/78 | HR 65 | Temp 98.0°F | Ht 75.0 in | Wt 200.2 lb

## 2022-11-13 DIAGNOSIS — M778 Other enthesopathies, not elsewhere classified: Secondary | ICD-10-CM | POA: Diagnosis not present

## 2022-11-13 DIAGNOSIS — Z136 Encounter for screening for cardiovascular disorders: Secondary | ICD-10-CM | POA: Diagnosis not present

## 2022-11-13 DIAGNOSIS — Z0000 Encounter for general adult medical examination without abnormal findings: Secondary | ICD-10-CM

## 2022-11-13 LAB — URINALYSIS, ROUTINE W REFLEX MICROSCOPIC
Bilirubin, UA: NEGATIVE
Glucose, UA: NEGATIVE
Ketones, UA: NEGATIVE
Leukocytes,UA: NEGATIVE
Nitrite, UA: NEGATIVE
Protein,UA: NEGATIVE
Specific Gravity, UA: 1.015 (ref 1.005–1.030)
Urobilinogen, Ur: 0.2 mg/dL (ref 0.2–1.0)
pH, UA: 7.5 (ref 5.0–7.5)

## 2022-11-13 LAB — MICROSCOPIC EXAMINATION
Bacteria, UA: NONE SEEN
WBC, UA: NONE SEEN /[HPF] (ref 0–5)

## 2022-11-13 MED ORDER — PREDNISONE 10 MG PO TABS: 10.0000 mg | ORAL_TABLET | Freq: Every day | ORAL | 0 refills | Status: DC

## 2022-11-14 LAB — LIPID PANEL
Chol/HDL Ratio: 5.8 ratio — ABNORMAL HIGH (ref 0.0–5.0)
Cholesterol, Total: 210 mg/dL — ABNORMAL HIGH (ref 100–199)
HDL: 36 mg/dL — ABNORMAL LOW (ref >39)
LDL Chol Calc (NIH): 145 mg/dL — ABNORMAL HIGH (ref 0–99)
Triglycerides: 162 mg/dL — ABNORMAL HIGH (ref 0–149)
VLDL Cholesterol Cal: 29 mg/dL (ref 5–40)

## 2022-11-14 LAB — CBC WITH DIFFERENTIAL/PLATELET
Basophils Absolute: 0.1 10*3/uL (ref 0.0–0.2)
Basos: 1 %
EOS (ABSOLUTE): 0.2 10*3/uL (ref 0.0–0.4)
Eos: 2 %
Hematocrit: 47.3 % (ref 37.5–51.0)
Hemoglobin: 15.6 g/dL (ref 13.0–17.7)
Immature Grans (Abs): 0 10*3/uL (ref 0.0–0.1)
Immature Granulocytes: 0 %
Lymphocytes Absolute: 2.7 10*3/uL (ref 0.7–3.1)
Lymphs: 37 %
MCH: 28.1 pg (ref 26.6–33.0)
MCHC: 33 g/dL (ref 31.5–35.7)
MCV: 85 fL (ref 79–97)
Monocytes Absolute: 0.7 10*3/uL (ref 0.1–0.9)
Monocytes: 10 %
Neutrophils Absolute: 3.6 10*3/uL (ref 1.4–7.0)
Neutrophils: 50 %
Platelets: 322 10*3/uL (ref 150–450)
RBC: 5.56 x10E6/uL (ref 4.14–5.80)
RDW: 13.3 % (ref 11.6–15.4)
WBC: 7.2 10*3/uL (ref 3.4–10.8)

## 2022-11-14 LAB — COMPREHENSIVE METABOLIC PANEL
ALT: 21 [IU]/L (ref 0–44)
AST: 17 [IU]/L (ref 0–40)
Albumin: 4.4 g/dL (ref 3.8–4.9)
Alkaline Phosphatase: 90 [IU]/L (ref 44–121)
BUN/Creatinine Ratio: 10 (ref 9–20)
BUN: 11 mg/dL (ref 6–24)
Bilirubin Total: 0.6 mg/dL (ref 0.0–1.2)
CO2: 25 mmol/L (ref 20–29)
Calcium: 9.7 mg/dL (ref 8.7–10.2)
Chloride: 102 mmol/L (ref 96–106)
Creatinine, Ser: 1.06 mg/dL (ref 0.76–1.27)
Globulin, Total: 2.9 g/dL (ref 1.5–4.5)
Glucose: 76 mg/dL (ref 70–99)
Potassium: 4.4 mmol/L (ref 3.5–5.2)
Sodium: 140 mmol/L (ref 134–144)
Total Protein: 7.3 g/dL (ref 6.0–8.5)
eGFR: 85 mL/min/{1.73_m2} (ref >59)

## 2022-11-14 LAB — PSA: Prostate Specific Ag, Serum: 1.7 ng/mL (ref 0.0–4.0)

## 2022-11-14 LAB — TSH: TSH: 2.6 u[IU]/mL (ref 0.450–4.500)

## 2022-11-20 ENCOUNTER — Ambulatory Visit: Payer: PRIVATE HEALTH INSURANCE | Admitting: Dermatology

## 2022-11-27 NOTE — Progress Notes (Unsigned)
There were no vitals taken for this visit.   Subjective:    Patient ID: Paul Little, male    DOB: 1971-01-22, 52 y.o.   MRN: [id number]  HPI: Paul Little is a 52 y.o. male  No chief complaint on file.  ARM PAIN Duration: {Blank single:19197::"chronic","days","weeks","months"} Location: {Blank single:19197::"left","right","bilateral","wrist","forearm","elbow","upper arm","biceps","triceps","shoulder"} Mechanism of injury: {Blank single:19197::"trauma","unknown"} Onset: {Blank single:19197::"sudden","gradual"} Severity: {Blank single:19197::"mild","moderate","severe","1/10","2/10","3/10","4/10","5/10","6/10","7/10","8/10","9/10","10/10"}  Quality:  {Blank multiple:19196::"sharp","dull","aching","burning","cramping","ill-defined","itchy","pressure-like","pulling","shooting","sore","stabbing","tender","tearing","throbbing"} Frequency: {Blank single:19197::"constant","intermittent","occasional","rare","every few minutes","a few times a hour","a few times a day","a few times a week","a few times a month","a few times a year"} Radiation: {Blank single:19197::"yes","no"} Aggravating factors: {Blank multiple:19196::"weight bearing","walking","running","stairs","bending","movement","prolonged sitting"}  Alleviating factors: {Blank multiple:19196::"nothing","ice","physical therapy","HEP","APAP","NSAIDs","brace","crutches","rest"}  Status: {Blank multiple:19196::"better","worse","stable","fluctuating"} Treatments attempted: {Blank multiple:19196::"none","rest","ice","heat","APAP","ibuprofen","aleve","physical therapy","HEP"}  Relief with NSAIDs?:  {Blank single:19197::"No NSAIDs Taken","no","mild","moderate","significant"} Swelling: {Blank single:19197::"yes","no"} Redness: {Blank single:19197::"yes","no"}  Warmth: {Blank single:19197::"yes","no"} Trauma: {Blank single:19197::"yes","no"} Chest pain: {Blank single:19197::"yes","no"}  Shortness of breath: {Blank single:19197::"yes","no"}   Fever: {Blank single:19197::"yes","no"} Decreased sensation: {Blank single:19197::"yes","no"} Paresthesias: {Blank single:19197::"yes","no"} Weakness: {Blank single:19197::"yes","no"}  Relevant past medical, surgical, family and social history reviewed and updated as indicated. Interim medical history since our last visit reviewed. Allergies and medications reviewed and updated.  Review of Systems  Per HPI unless specifically indicated above     Objective:    There were no vitals taken for this visit.  Wt Readings from Last 3 Encounters:  11/13/22 200 lb 3.2 oz (90.8 kg)  11/11/21 190 lb (86.2 kg)  11/11/21 190 lb 9.6 oz (86.5 kg)    Physical Exam  Results for orders placed or performed in visit on 11/13/22  Microscopic Examination   Urine  Result Value Ref Range   WBC, UA None seen 0 - 5 /hpf   RBC, Urine 0-2 0 - 2 /hpf   Epithelial Cells (non renal) 0-10 0 - 10 /hpf   Bacteria, UA None seen None seen/Few  TSH  Result Value Ref Range   TSH 2.600 0.450 - 4.500 uIU/mL  PSA  Result Value Ref Range   Prostate Specific Ag, Serum 1.7 0.0 - 4.0 ng/mL  Lipid panel  Result Value Ref Range   Cholesterol, Total 210 (H) 100 - 199 mg/dL   Triglycerides 542 (H) 0 - 149 mg/dL   HDL 36 (L) >70 mg/dL   VLDL Cholesterol Cal 29 5 - 40 mg/dL   LDL Chol Calc (NIH) 623 (H) 0 - 99 mg/dL   Chol/HDL Ratio 5.8 (H) 0.0 - 5.0 ratio  CBC with Differential/Platelet  Result Value Ref Range   WBC 7.2 3.4 - 10.8 x10E3/uL   RBC 5.56 4.14 - 5.80 x10E6/uL   Hemoglobin 15.6 13.0 - 17.7 g/dL   Hematocrit 76.2 83.1 - 51.0 %   MCV 85 79 - 97 fL   MCH 28.1 26.6 - 33.0 pg   MCHC 33.0 31.5 - 35.7 g/dL   RDW 51.7 61.6 - 07.3 %   Platelets 322 150 - 450 x10E3/uL   Neutrophils 50 Not Estab. %   Lymphs 37 Not Estab. %   Monocytes 10 Not Estab. %   Eos 2 Not Estab. %   Basos 1 Not Estab. %   Neutrophils Absolute 3.6 1.4 - 7.0 x10E3/uL   Lymphocytes Absolute 2.7 0.7 - 3.1 x10E3/uL   Monocytes  Absolute 0.7 0.1 - 0.9 x10E3/uL   EOS (ABSOLUTE) 0.2 0.0 - 0.4 x10E3/uL   Basophils Absolute 0.1 0.0 - 0.2 x10E3/uL   Immature Granulocytes 0 Not Estab. %   Immature Grans (Abs) 0.0 0.0 - 0.1 x10E3/uL  Comprehensive  metabolic panel  Result Value Ref Range   Glucose 76 70 - 99 mg/dL   BUN 11 6 - 24 mg/dL   Creatinine, Ser 4.40 0.76 - 1.27 mg/dL   eGFR 85 >34 VQ/QVZ/5.63   BUN/Creatinine Ratio 10 9 - 20   Sodium 140 134 - 144 mmol/L   Potassium 4.4 3.5 - 5.2 mmol/L   Chloride 102 96 - 106 mmol/L   CO2 25 20 - 29 mmol/L   Calcium 9.7 8.7 - 10.2 mg/dL   Total Protein 7.3 6.0 - 8.5 g/dL   Albumin 4.4 3.8 - 4.9 g/dL   Globulin, Total 2.9 1.5 - 4.5 g/dL   Bilirubin Total 0.6 0.0 - 1.2 mg/dL   Alkaline Phosphatase 90 44 - 121 IU/L   AST 17 0 - 40 IU/L   ALT 21 0 - 44 IU/L  Urinalysis, Routine w reflex microscopic  Result Value Ref Range   Specific Gravity, UA 1.015 1.005 - 1.030   pH, UA 7.5 5.0 - 7.5   Color, UA Yellow Yellow   Appearance Ur Clear Clear   Leukocytes,UA Negative Negative   Protein,UA Negative Negative/Trace   Glucose, UA Negative Negative   Ketones, UA Negative Negative   RBC, UA Trace (A) Negative   Bilirubin, UA Negative Negative   Urobilinogen, Ur 0.2 0.2 - 1.0 mg/dL   Nitrite, UA Negative Negative   Microscopic Examination See below:       Assessment & Plan:   Problem List Items Addressed This Visit   None    Follow up plan: No follow-ups on file.

## 2022-11-28 ENCOUNTER — Ambulatory Visit (INDEPENDENT_AMBULATORY_CARE_PROVIDER_SITE_OTHER): Payer: PRIVATE HEALTH INSURANCE | Admitting: Nurse Practitioner

## 2022-11-28 ENCOUNTER — Encounter: Payer: Self-pay | Admitting: Nurse Practitioner

## 2022-11-28 VITALS — BP 124/80 | HR 97 | Temp 98.4°F | Ht 75.0 in | Wt 200.0 lb

## 2022-11-28 DIAGNOSIS — M25522 Pain in left elbow: Secondary | ICD-10-CM | POA: Diagnosis not present

## 2022-12-19 ENCOUNTER — Ambulatory Visit: Payer: PRIVATE HEALTH INSURANCE | Admitting: Nurse Practitioner

## 2023-01-01 ENCOUNTER — Encounter: Payer: Self-pay | Admitting: Dermatology

## 2023-01-01 ENCOUNTER — Ambulatory Visit (INDEPENDENT_AMBULATORY_CARE_PROVIDER_SITE_OTHER): Payer: PRIVATE HEALTH INSURANCE | Admitting: Dermatology

## 2023-01-01 DIAGNOSIS — Z86018 Personal history of other benign neoplasm: Secondary | ICD-10-CM | POA: Diagnosis not present

## 2023-01-01 DIAGNOSIS — L720 Epidermal cyst: Secondary | ICD-10-CM | POA: Diagnosis not present

## 2023-01-01 DIAGNOSIS — D229 Melanocytic nevi, unspecified: Secondary | ICD-10-CM

## 2023-01-01 NOTE — Patient Instructions (Signed)

## 2023-01-01 NOTE — Progress Notes (Signed)
   New Patient Visit   Subjective  Paul Little is a 52 y.o. male who presents for the following: spots at abdomen and back. Spot at lower abdomen has changed, one at right upper chest. Bump at back present for years, has never drained. Patient with hx of dysplastic nevi.   The patient has spots, moles and lesions to be evaluated, some may be new or changing and the patient may have concern these could be cancer.   The following portions of the chart were reviewed this encounter and updated as appropriate: medications, allergies, medical history  Review of Systems:  No other skin or systemic complaints except as noted in HPI or Assessment and Plan.  Objective  Well appearing patient in no apparent distress; mood and affect are within normal limits.   A focused examination was performed of the following areas: Back, chest, abdomen  Relevant exam findings are noted in the Assessment and Plan.    Assessment & Plan   MELANOCYTIC NEVI Exam: Tan-brown to dark brown symmetric macules on right chest, right abdomen, left abdomen  Treatment Plan: Benign appearing on exam today. Recommend observation. Call clinic for new or changing moles. Recommend daily use of broad spectrum spf 30+ sunscreen to sun-exposed areas.   EPIDERMAL INCLUSION CYST Exam: Subcutaneous nodule at upper central back paraspinal 1.0 cm  Benign-appearing. Exam most consistent with an epidermal inclusion cyst. Discussed that a cyst is a benign growth that can grow over time and sometimes get irritated or inflamed. Recommend observation if it is not bothersome. Discussed option of surgical excision to remove it if it is growing, symptomatic, or other changes noted. Please call for new or changing lesions so they can be evaluated. Patient requests excision   Return for Surgery, with Dr. Katrinka Blazing.  Anise Salvo, RMA, am acting as scribe for Elie Goody, MD .   Documentation: I have reviewed the above  documentation for accuracy and completeness, and I agree with the above.  Elie Goody, MD

## 2023-02-28 ENCOUNTER — Encounter: Payer: Self-pay | Admitting: Dermatology

## 2023-02-28 ENCOUNTER — Ambulatory Visit (INDEPENDENT_AMBULATORY_CARE_PROVIDER_SITE_OTHER): Payer: PRIVATE HEALTH INSURANCE | Admitting: Dermatology

## 2023-02-28 ENCOUNTER — Telehealth: Payer: Self-pay

## 2023-02-28 ENCOUNTER — Ambulatory Visit: Payer: PRIVATE HEALTH INSURANCE

## 2023-02-28 DIAGNOSIS — L720 Epidermal cyst: Secondary | ICD-10-CM | POA: Diagnosis not present

## 2023-02-28 MED ORDER — MUPIROCIN 2 % EX OINT: 1.0000 | TOPICAL_OINTMENT | Freq: Every day | CUTANEOUS | 0 refills | Status: DC

## 2023-02-28 NOTE — Progress Notes (Signed)
   Follow-Up Visit   Subjective  Paul Little is a 53 y.o. male who presents for the following: Excision of cyst at upper central back paraspinal  The following portions of the chart were reviewed this encounter and updated as appropriate: medications, allergies, medical history  Review of Systems:  No other skin or systemic complaints except as noted in HPI or Assessment and Plan.  Objective  Well appearing patient in no apparent distress; mood and affect are within normal limits.  A focused examination was performed of the following areas: back Relevant physical exam findings are noted in the Assessment and Plan.   upper central back paraspinal Subcutaneous nodule 1.0 cm   Assessment & Plan   EPIDERMAL INCLUSION CYST upper central back paraspinal Skin excision - upper central back paraspinal  Total excision diameter (cm):  1.7 Informed consent: discussed and consent obtained   Timeout: patient name, date of birth, surgical site, and procedure verified   Procedure prep:  Patient was prepped and draped in usual sterile fashion Prep type:  Chlorhexidine Anesthesia: the lesion was anesthetized in a standard fashion   Anesthetic:  1% lidocaine  w/ epinephrine 1-100,000 buffered w/ 8.4% NaHCO3 (3 cc lido w/epi, 3 cc bupivicaine) Instrument used: #15 blade   Hemostasis achieved with: pressure   Outcome: patient tolerated procedure well with no complications    Skin repair - upper central back paraspinal Complexity:  Intermediate Final length (cm):  4.3 Informed consent: discussed and consent obtained   Timeout: patient name, date of birth, surgical site, and procedure verified   Procedure prep:  Patient was prepped and draped in usual sterile fashion Prep type:  Chlorhexidine Anesthesia: the lesion was anesthetized in a standard fashion   Anesthetic:  1% lidocaine  w/ epinephrine 1-100,000 buffered w/ 8.4% NaHCO3 Reason for type of repair: reduce tension to allow closure,  reduce the risk of dehiscence, infection, and necrosis, reduce subcutaneous dead space and avoid a hematoma, allow closure of the large defect and preserve normal anatomy   Undermining: edges could be approximated without difficulty and edges undermined   Subcutaneous layers (deep stitches):  Suture size:  3-0 Suture type: Monocryl (poliglecaprone 25)   Stitches:  Buried vertical mattress Fine/surface layer approximation (top stitches):  Suture size:  4-0 Suture type: Prolene (polypropylene)   Stitches: simple running   Suture removal (days):  7 Hemostasis achieved with: suture, pressure and electrodesiccation Outcome: patient tolerated procedure well with no complications   Post-procedure details: sterile dressing applied and wound care instructions given   Dressing type: petrolatum, bandage and pressure dressing   Specimen 1 - Surgical pathology Differential Diagnosis: Cyst vs Other  Check Margins: No Subcutaneous nodule 1.0 cm   Return in about 1 week (around 03/07/2023) for Suture Removal.  LILLETTE Lonell Drones, RMA, am acting as scribe for Boneta Sharps, MD .   Documentation: I have reviewed the above documentation for accuracy and completeness, and I agree with the above.  Boneta Sharps, MD

## 2023-02-28 NOTE — Patient Instructions (Signed)

## 2023-02-28 NOTE — Telephone Encounter (Signed)
Patient doing fine after surgery this am. Will let us know if any concerns. Butch Penny., RMA

## 2023-03-05 ENCOUNTER — Encounter: Payer: Self-pay | Admitting: Dermatology

## 2023-03-05 LAB — SURGICAL PATHOLOGY

## 2023-03-06 ENCOUNTER — Telehealth: Payer: Self-pay

## 2023-03-06 NOTE — Telephone Encounter (Signed)
Patient called to cancel his suture removal appt tomorrow, the sutures have all came out.

## 2023-03-06 NOTE — Telephone Encounter (Signed)
Patient report he will get someone to take a pic of his back and he will send the pic through mychart.

## 2023-03-07 ENCOUNTER — Ambulatory Visit: Payer: PRIVATE HEALTH INSURANCE

## 2023-03-30 DIAGNOSIS — Z87891 Personal history of nicotine dependence: Secondary | ICD-10-CM | POA: Diagnosis not present

## 2023-03-30 DIAGNOSIS — J40 Bronchitis, not specified as acute or chronic: Secondary | ICD-10-CM | POA: Diagnosis not present

## 2023-03-30 DIAGNOSIS — J111 Influenza due to unidentified influenza virus with other respiratory manifestations: Secondary | ICD-10-CM | POA: Diagnosis not present

## 2023-04-01 DIAGNOSIS — R6889 Other general symptoms and signs: Secondary | ICD-10-CM | POA: Diagnosis not present

## 2023-04-01 DIAGNOSIS — J40 Bronchitis, not specified as acute or chronic: Secondary | ICD-10-CM | POA: Diagnosis not present

## 2023-09-19 ENCOUNTER — Ambulatory Visit: Payer: PRIVATE HEALTH INSURANCE | Admitting: Nurse Practitioner

## 2023-09-19 ENCOUNTER — Encounter: Payer: Self-pay | Admitting: Nurse Practitioner

## 2023-09-19 ENCOUNTER — Ambulatory Visit
Admission: RE | Admit: 2023-09-19 | Discharge: 2023-09-19 | Disposition: A | Discharge status: Home / Self Care | Source: Ambulatory Visit | Attending: Nurse Practitioner | Admitting: Nurse Practitioner

## 2023-09-19 VITALS — BP 107/72 | HR 59 | Temp 98.2°F | Resp 16 | Ht 75.0 in | Wt 206.4 lb

## 2023-09-19 DIAGNOSIS — M25562 Pain in left knee: Secondary | ICD-10-CM

## 2023-09-19 DIAGNOSIS — M25561 Pain in right knee: Secondary | ICD-10-CM

## 2023-09-19 DIAGNOSIS — G8929 Other chronic pain: Secondary | ICD-10-CM | POA: Diagnosis present

## 2023-09-19 NOTE — Progress Notes (Signed)
 BP 107/72 (BP Location: Left Arm, Patient Position: Sitting, Cuff Size: Normal)   Pulse (!) 59   Temp 98.2 F (36.8 C) (Oral)   Resp 16   Ht 6' 3 (1.905 m)   Wt 206 lb 6.4 oz (93.6 kg)   SpO2 94%   BMI 25.80 kg/m    Subjective:    Patient ID: Paul Little, male    DOB: 02/18/1970, 53 y.o.   MRN: [id number]  HPI: Paul Little is a 53 y.o. male  Chief Complaint  Patient presents with   Knee Pain    Left>Right since he was 20 injured in [employer].    KNEE PAIN Patient states he is having knee pain on both knees since he was 53 years old when he was in the [employer].  He can date the time back to boot camp.  Has been ongoing since that time but progressively worsening.  Usually in the back of his knee and the front top part of his knee.  Duration: years Involved knee: bilateral Mechanism of injury: unknown Location:posterior Onset: gradual Severity: 2/10 and gets up to 10/10 Quality:  sharp Frequency: constant Radiation: no Aggravating factors: weight bearing and walking  Alleviating factors: NSAIDs  Status: worse Treatments attempted: none  Relief with NSAIDs?:  mild Weakness with weight bearing or walking: yes Sensation of giving way: yes Locking: yes Popping: yes Bruising: no Swelling: no Redness: no Paresthesias/decreased sensation: yes Fevers: no  Relevant past medical, surgical, family and social history reviewed and updated as indicated. Interim medical history since our last visit reviewed. Allergies and medications reviewed and updated.  Review of Systems  Musculoskeletal:        Bilateral knee pain    Per HPI unless specifically indicated above     Objective:    BP 107/72 (BP Location: Left Arm, Patient Position: Sitting, Cuff Size: Normal)   Pulse (!) 59   Temp 98.2 F (36.8 C) (Oral)   Resp 16   Ht 6' 3 (1.905 m)   Wt 206 lb 6.4 oz (93.6 kg)   SpO2 94%   BMI 25.80 kg/m   Wt Readings from Last 3 Encounters:  09/19/23 206 lb 6.4  oz (93.6 kg)  11/28/22 200 lb (90.7 kg)  11/13/22 200 lb 3.2 oz (90.8 kg)    Physical Exam Vitals and nursing note reviewed.  Constitutional:      General: He is not in acute distress.    Appearance: Normal appearance. He is not ill-appearing, toxic-appearing or diaphoretic.  HENT:     Head: Normocephalic.     Right Ear: External ear normal.     Left Ear: External ear normal.     Nose: Nose normal. No congestion or rhinorrhea.     Mouth/Throat:     Mouth: Mucous membranes are moist.  Eyes:     General:        Right eye: No discharge.        Left eye: No discharge.     Extraocular Movements: Extraocular movements intact.     Conjunctiva/sclera: Conjunctivae normal.     Pupils: Pupils are equal, round, and reactive to light.  Cardiovascular:     Rate and Rhythm: Normal rate and regular rhythm.     Heart sounds: No murmur heard. Pulmonary:     Effort: Pulmonary effort is normal. No respiratory distress.     Breath sounds: Normal breath sounds. No wheezing, rhonchi or rales.  Abdominal:     General: Abdomen  is flat. Bowel sounds are normal.  Musculoskeletal:     Cervical back: Normal range of motion and neck supple.     Right knee: No swelling or bony tenderness. Normal range of motion. No tenderness.     Left knee: No swelling or bony tenderness. Normal range of motion. No tenderness.  Skin:    General: Skin is warm and dry.     Capillary Refill: Capillary refill takes less than 2 seconds.  Neurological:     General: No focal deficit present.     Mental Status: He is alert and oriented to person, place, and time.  Psychiatric:        Mood and Affect: Mood normal.        Behavior: Behavior normal.        Thought Content: Thought content normal.        Judgment: Judgment normal.     Results for orders placed or performed in visit on 02/28/23  Surgical pathology   Collection Time: 02/28/23 12:00 AM  Result Value Ref Range   SURGICAL PATHOLOGY      SURGICAL  PATHOLOGY [facility] [address], [address] [place], [place] [address] Telephone [phone] or [phone] Fax [phone]  REPORT OF DERMATOPATHOLOGY   Accession #: [id number] Patient Name: Paul Little, Paul Little Visit # : [id number]  MRN: [id number] Cytotechnologist: Melia Rolla Wanda GORMAN., Dermatopathologist, Electronic Signature DOB/Age 07/14/1970 (Age: 50) Gender: M Collected Date: 02/28/2023 Received Date: 02/28/2023  FINAL DIAGNOSIS       1. Skin (M), upper central back paraspinal :       EPIDERMOID CYST       DATE SIGNED OUT: 03/05/2023 ELECTRONIC SIGNATURE : Melia Rolla, Wanda GORMAN., Dermatopathologist, Electronic Signature  MICROSCOPIC DESCRIPTION 1. There is a cyst lined by squamous epithelium with granular layer, containing loose lamellar keratin.  CASE COMMENTS STAINS USED IN DIAGNOSIS: H&E H&E    CLINICAL HISTORY  SPECIMEN(S) OBTAINED 1. Skin (M), Upper Central Back Paraspinal  SPECIMEN COMMENTS: 1. Subc utaneous nodule 1.0 cm SPECIMEN CLINICAL INFORMATION: 1. Epidermal inclusion cyst, cyst vs other    Gross Description 1. Shape:  Includes limited fat; elliptical      Size:  31 x 16 x 16 mm      Lesion:  Clinically mentioned cyst observed      Orientation:  None; all margins inked      Block Summary: Specimen has been submitted in representative sections with 4      cross sections submitted in blocks labeled 1A and 1B.      2 Block(s) submitted. ([place])      A - B = 2        Report signed out from the following location(s) [place] [address] [place], [place] [address] CLIA #: [id number]  [facility] [address] [place], [place] [address] CLIA #: [id number]       Assessment & Plan:   Problem List Items Addressed This Visit   None Visit Diagnoses       Chronic pain of both knees    -  Primary   Ongoing since  boot camp at 20. Progessively worsening since that time.  Will obtain xrays. okay to use PRN NSAIDS. Will make recommendations based on results.   Relevant Orders   DG Knee Complete 4 Views Right   DG Knee Complete 4 Views Left        Follow up plan: No  follow-ups on file.

## 2023-10-01 ENCOUNTER — Ambulatory Visit: Payer: Self-pay | Admitting: Nurse Practitioner

## 2023-10-01 DIAGNOSIS — G8929 Other chronic pain: Secondary | ICD-10-CM

## 2023-10-17 NOTE — Telephone Encounter (Signed)
 Copied from CRM #[id number]. Topic: Clinical - Lab/Test Results >> Oct 17, 2023 12:50 PM Paul Little wrote: Reason for CRM: patient called today and read him the Xray results and the patient a little unhappy with not receiving a phone call about his results  And also would like to ask if the provider will refer him to an orthopedic doctor   Pt num [phone] (M) Patient does not check mychart message

## 2023-10-22 ENCOUNTER — Ambulatory Visit (INDEPENDENT_AMBULATORY_CARE_PROVIDER_SITE_OTHER)

## 2023-10-22 DIAGNOSIS — M1712 Unilateral primary osteoarthritis, left knee: Secondary | ICD-10-CM | POA: Diagnosis not present

## 2023-10-22 DIAGNOSIS — M1711 Unilateral primary osteoarthritis, right knee: Secondary | ICD-10-CM

## 2023-10-22 MED ORDER — TRIAMCINOLONE ACETONIDE 40 MG/ML IJ SUSP
40.0000 mg | INTRAMUSCULAR | Status: AC | PRN
  Administered 2023-10-22: 40 mg via INTRA_ARTICULAR

## 2023-10-22 NOTE — Progress Notes (Signed)
 Orthopaedic Surgery New Patient Visit   History of Present Illness: The patient is a 53 y.o. male seen in clinic for bilateral knee pain, left knee is worse than the right.  He reports his bilateral knee pain started at the age of 25 when he was in the [employer]	[place].  He does not recall any specific injury or trauma when the symptoms began.  He notes pain in the knees has progressively worsened over the years.  He reports his symptoms are worse with walking and standing and better when laying down.  Most of his pain is located on the inside part of the knees as well as in the back of the knee.  Associated symptoms include subjective weakness, stiffness and occasional pain that wakes him from sleep.  Patient has tried some home exercises as well as ibuprofen without much relief.  He also tried gabapentin years ago for underlying neuropathy in his feet.  Patient occupation involves driving a truck and towing.     Past Medical, Social and Family History: Past Medical History:  Diagnosis Date   Back pain    Dysplastic nevi 06/04/2013   right dorsal foot, severe atypia, excised 07/09/2013   Dysplastic nevus 06/04/2013   right thigh, right lower leg, mild atypia   Kidney stone    No past surgical history on file. No Known Allergies Current Outpatient Medications on File Prior to Visit  Medication Sig Dispense Refill   mupirocin  ointment (BACTROBAN ) 2 % Apply 1 Application topically daily. 22 g 0   No current facility-administered medications on file prior to visit.   Social History   Tobacco Use   Smoking status: Former    Current packs/day: 0.00    Average packs/day: 1 pack/day for 29.0 years (29.0 ttl pk-yrs)    Types: Cigarettes    Quit date: 01/2022    Years since quitting: 1.7   Smokeless tobacco: Never  Vaping Use   Vaping status: Never Used  Substance Use Topics   Alcohol use: Yes    Comment: occassionally   Drug use: No      I have reviewed past medical, surgical,  social and family history, medications and allergies as documented in the EMR.  Review of Systems - A ROS was performed including pertinent positives and negatives as documented in the HPI.     Physical Exam:  General/Constitutional: NAD Vascular: No edema, swelling or tenderness, except as noted in detailed exam Integumentary: No impressive skin lesions present, except as noted in detailed exam Neuro/Psych: Normal mood and affect, oriented to person, place and time Musculoskeletal: Normal, except as noted in detailed exam and in HPI   Bilateral knee Examination (focused):  On examination of the bilateral knees, there are no obvious signs of trauma or deformity.  Skin is intact circumferentially with no erythema or ecchymosis.    RIGHT LEFT  AROM (degrees) PROM (degrees)  0-125 0-130 0-125 0-130  Palpation (pain): Effusion none none   Medial joint line tenderness positive positive   Lateral joint line tenderness negative negative  Instability: Varus @ 0 degrees            @ 30 degrees none none none none   Valgus @ 0 degrees             @ 30 degrees none none none none  Special Tests: Lachman's negative negative   Posterior drawer none none   Anterior drawer none none   Pivot shift Not tested Not tested  McMurray negative positive  Patella: Palpation (pain) negative negative   Mobility < 2 quadrants < 2 quadrants  Other: Knee flexion strength   5/5  5/5   Knee extension strength  5/5  5/5    Vascular/Lymphatic: 2+ dorsalis pedis pulses, foot warm and well perfused Neurologic: Sensation intact to light touch to Superficial peroneal/Deep peroneal/Tibial/Sural/Saphenous nerves     XR bilateral knee imaging: X-rays of the bilateral knees, 4 views including weightbearing AP, PA notch, sunrise, lateral were obtained on 09/19/2023 at [facility] facility and were available for my review.  Per my interpretation, there are no fractures or dislocations.  Mild medial joint space  narrowing of the bilateral knees.  Enthesophytes noted on bilateral anterior patella surfaces.    Radiology Read: 1. Mild joint narrowing left femorotibial joint, right medial femorotibial joint. 2. Small left suprapatellar bursal effusion. 3. Small enthesophyte of the anterior superior right patella at the quadriceps insertion.  Assessment:  Bilateral knee osteoarthritis, left knee more symptomatic than the right  Plan:  Patient was seen and examined in office today. We reviewed patient's history, examination, and imaging in detail. Based on information available for this encounter, patient likely symptomatic from bilateral knee osteoarthritis.  We discussed the natural course of this disease process and treatment options today.  He does have a stable knee on examination with medial joint line tenderness.  We reviewed conservative treatment measures today including nonsteroidal anti-inflammatories, formal physical therapy and intra-articular steroid injections.  He states anti-inflammatories have not been successful to this point.  He does not have time with his work to do formal physical therapy.  Therefore we have given him AAOS exercises that he can perform at home.  He is also interested in a steroid injection for his left knee only today.   Left knee steroid injection procedure: The risks and benefits of a cortisone injection were discussed and the patient wishes to proceed.  The anterolateral compartment of the left knee was cleansed with alcohol swabs and ChloraPrep.  The skin was flash cooled with Ethyl Chloride, and using sterile technique, a 21 gauge needle was immediately introduced into the joint.  After aspiration revealed a flash of clear yellow tinged joint fluid the injectate which consisted of 1cc of 40 mg/mL of Kenalog and 4cc of 0.5% ropivacaine easily flowed into the joint.  The needle was withdrawn and pressure was applied for hemostasis.  A bandage was applied.  The patient  tolerated the procedure well.   Cortisone Injections You have received a cortisone injection today. This injection consists of a numbing medication and cortisone.  There may be side effects after receiving the injection.   If you are diabetic: Your blood sugar may increase for up to 48 hours.  Check it more often than normal.   General reactions: A cortisone flare reaction. This generally occurs 6-8 hours after receiving the injection.  Ice the area and take Tylenol for pain.  If the pain lasts longer than 48 hours call the office.   Some patients may experience flushing, increased heart rate, red face or increase in body temperature.  This is rare but can happen.   Over the counter Benadryl, if appropriate, often reduces these symptoms.  For a severe reaction contact your family doctor or go to the emergency room.  If you have any other questions, feel free to ask.     Patient education material was provided.  All questions, concerns and comments were addressed to the best of my ability.  Follow-up: 6 weeks   Arlyss GEANNIE Schneider, DO Orthopedic Surgery & Sports Medicine [place]   Large Joint Inj: L knee on 10/22/2023 9:10 AM Details: (21-gauge) 1.5 in needle, anterolateral approach Medications: 40 mg triamcinolone  acetonide 40 MG/ML (4 cc 0.5% ropivacaine) Outcome: tolerated well, no immediate complications Patient was prepped and draped in the usual sterile fashion.

## 2023-10-22 NOTE — Patient Instructions (Signed)

## 2023-11-14 ENCOUNTER — Encounter: Payer: Self-pay | Admitting: Nurse Practitioner

## 2023-11-14 ENCOUNTER — Ambulatory Visit (INDEPENDENT_AMBULATORY_CARE_PROVIDER_SITE_OTHER): Payer: Self-pay | Admitting: Nurse Practitioner

## 2023-11-14 VITALS — BP 107/70 | HR 69 | Temp 97.8°F | Ht 75.0 in | Wt 207.6 lb

## 2023-11-14 DIAGNOSIS — Z0000 Encounter for general adult medical examination without abnormal findings: Secondary | ICD-10-CM | POA: Diagnosis not present

## 2023-11-14 DIAGNOSIS — Z136 Encounter for screening for cardiovascular disorders: Secondary | ICD-10-CM | POA: Diagnosis not present

## 2023-11-14 NOTE — Progress Notes (Signed)
 BP 107/70   Pulse 69   Temp 97.8 F (36.6 C) (Oral)   Ht 6' 3 (1.905 m)   Wt 207 lb 9.6 oz (94.2 kg)   SpO2 98%   BMI 25.95 kg/m    Subjective:    Patient ID: Paul Little, male    DOB: 17-Nov-1970, 53 y.o.   MRN: [id number]  HPI: Paul Little is a 53 y.o. male presenting on 11/14/2023 for comprehensive medical examination. Current medical complaints include:none  He currently lives with: Interim Problems from his last visit: no   Denies HA, CP, SOB, dizziness, palpitations, visual changes, and lower extremity swelling.   Depression Screen done today and results listed below:     11/14/2023    8:13 AM 09/19/2023    8:48 AM 11/28/2022    3:02 PM 11/13/2022    9:51 AM 10/10/2021    9:13 AM  Depression screen PHQ 2/9  Decreased Interest 0  0 0 2  Down, Depressed, Hopeless 0 0 0 0 2  PHQ - 2 Score 0 0 0 0 4  Altered sleeping 0 0 0 0 0  Tired, decreased energy 0 0 0 0 1  Change in appetite 0 0 0 0 1  Feeling bad or failure about yourself  0 0 0 0 2  Trouble concentrating 0 0 0 0 2  Moving slowly or fidgety/restless 0 0 0 0 0  Suicidal thoughts 0 0 0 0 0  PHQ-9 Score 0 0 0 0 10  Difficult doing work/chores Not difficult at all   Not difficult at all Somewhat difficult    The patient does not have a history of falls. I did complete a risk assessment for falls. A plan of care for falls was documented.   Past Medical History:  Past Medical History:  Diagnosis Date   Back pain    Dysplastic nevi 06/04/2013   right dorsal foot, severe atypia, excised 07/09/2013   Dysplastic nevus 06/04/2013   right thigh, right lower leg, mild atypia   Kidney stone     Surgical History:  History reviewed. No pertinent surgical history.  Medications:  No current outpatient medications on file prior to visit.   No current facility-administered medications on file prior to visit.    Allergies:  No Known Allergies  Social History:  Social History   Socioeconomic  History   Marital status: Single    Spouse name: Not on file   Number of children: Not on file   Years of education: Not on file   Highest education level: Not on file  Occupational History   Not on file  Tobacco Use   Smoking status: Former    Current packs/day: 0.00    Average packs/day: 1 pack/day for 29.0 years (29.0 ttl pk-yrs)    Types: Cigarettes    Quit date: 01/2022    Years since quitting: 1.8   Smokeless tobacco: Never  Vaping Use   Vaping status: Never Used  Substance and Sexual Activity   Alcohol use: Yes    Comment: occassionally   Drug use: No   Sexual activity: Yes  Other Topics Concern   Not on file  Social History Narrative   Not on file   Social Drivers of Health   Financial Resource Strain: Not on file  Food Insecurity: No Food Insecurity (11/13/2022)   Hunger Vital Sign    Worried About Running Out of Food in the Last Year: Never true    Ran  Out of Food in the Last Year: Never true  Transportation Needs: No Transportation Needs (11/13/2022)   PRAPARE - Administrator, Civil Service (Medical): No    Lack of Transportation (Non-Medical): No  Physical Activity: Not on file  Stress: Not on file  Social Connections: Not on file  Intimate Partner Violence: Not At Risk (11/13/2022)   Humiliation, Afraid, Rape, and Kick questionnaire    Fear of Current or Ex-Partner: No    Emotionally Abused: No    Physically Abused: No    Sexually Abused: No   Social History   Tobacco Use  Smoking Status Former   Current packs/day: 0.00   Average packs/day: 1 pack/day for 29.0 years (29.0 ttl pk-yrs)   Types: Cigarettes   Quit date: 01/2022   Years since quitting: 1.8  Smokeless Tobacco Never   Social History   Substance and Sexual Activity  Alcohol Use Yes   Comment: occassionally    Family History:  Family History  Problem Relation Age of Onset   Kidney cancer Mother    Basal cell carcinoma Father    Hyperlipidemia Father     Past  medical history, surgical history, medications, allergies, family history and social history reviewed with patient today and changes made to appropriate areas of the chart.   Review of Systems  Eyes:  Negative for blurred vision and double vision.  Respiratory:  Negative for shortness of breath.   Cardiovascular:  Negative for chest pain, palpitations and leg swelling.  Neurological:  Negative for dizziness and headaches.   All other ROS negative except what is listed above and in the HPI.      Objective:    BP 107/70   Pulse 69   Temp 97.8 F (36.6 C) (Oral)   Ht 6' 3 (1.905 m)   Wt 207 lb 9.6 oz (94.2 kg)   SpO2 98%   BMI 25.95 kg/m   Wt Readings from Last 3 Encounters:  11/14/23 207 lb 9.6 oz (94.2 kg)  09/19/23 206 lb 6.4 oz (93.6 kg)  11/28/22 200 lb (90.7 kg)    Physical Exam Vitals and nursing note reviewed.  Constitutional:      General: He is not in acute distress.    Appearance: Normal appearance. He is not ill-appearing, toxic-appearing or diaphoretic.  HENT:     Head: Normocephalic.     Right Ear: Tympanic membrane, ear canal and external ear normal.     Left Ear: Tympanic membrane, ear canal and external ear normal.     Nose: Nose normal. No congestion or rhinorrhea.     Mouth/Throat:     Mouth: Mucous membranes are moist.  Eyes:     General:        Right eye: No discharge.        Left eye: No discharge.     Extraocular Movements: Extraocular movements intact.     Conjunctiva/sclera: Conjunctivae normal.     Pupils: Pupils are equal, round, and reactive to light.  Cardiovascular:     Rate and Rhythm: Normal rate and regular rhythm.     Heart sounds: No murmur heard. Pulmonary:     Effort: Pulmonary effort is normal. No respiratory distress.     Breath sounds: Normal breath sounds. No wheezing, rhonchi or rales.  Abdominal:     General: Abdomen is flat. Bowel sounds are normal. There is no distension.     Palpations: Abdomen is soft.     Tenderness:  There is  no abdominal tenderness. There is no guarding.  Musculoskeletal:     Cervical back: Normal range of motion and neck supple.  Skin:    General: Skin is warm and dry.     Capillary Refill: Capillary refill takes less than 2 seconds.  Neurological:     General: No focal deficit present.     Mental Status: He is alert and oriented to person, place, and time.     Cranial Nerves: No cranial nerve deficit.     Motor: No weakness.     Deep Tendon Reflexes: Reflexes normal.  Psychiatric:        Mood and Affect: Mood normal.        Behavior: Behavior normal.        Thought Content: Thought content normal.        Judgment: Judgment normal.     Results for orders placed or performed in visit on 02/28/23  Surgical pathology   Collection Time: 02/28/23 12:00 AM  Result Value Ref Range   SURGICAL PATHOLOGY      SURGICAL PATHOLOGY [facility] [address] [place], [place] [address] Telephone [phone] or [phone] Fax [phone]  REPORT OF DERMATOPATHOLOGY   Accession #: [id number] Patient Name: TABIUS, ROOD Visit # : [id number]  MRN: [id number] Cytotechnologist: Melia Rolla Wanda GORMAN., Dermatopathologist, Electronic Signature DOB/Age 09/21/1970 (Age: 30) Gender: M Collected Date: 02/28/2023 Received Date: 02/28/2023  FINAL DIAGNOSIS       1. Skin (M), upper central back paraspinal :       EPIDERMOID CYST       DATE SIGNED OUT: 03/05/2023 ELECTRONIC SIGNATURE : Melia Rolla, Wanda GORMAN., Dermatopathologist, Electronic Signature  MICROSCOPIC DESCRIPTION 1. There is a cyst lined by squamous epithelium with granular layer, containing loose lamellar keratin.  CASE COMMENTS STAINS USED IN DIAGNOSIS: H&E H&E    CLINICAL HISTORY  SPECIMEN(S) OBTAINED 1. Skin (M), Upper Central Back Paraspinal  SPECIMEN COMMENTS: 1. Subc utaneous nodule 1.0 cm SPECIMEN CLINICAL INFORMATION: 1. Epidermal inclusion cyst, cyst vs  other    Gross Description 1. Shape:  Includes limited fat; elliptical      Size:  31 x 16 x 16 mm      Lesion:  Clinically mentioned cyst observed      Orientation:  None; all margins inked      Block Summary: Specimen has been submitted in representative sections with 4      cross sections submitted in blocks labeled 1A and 1B.      2 Block(s) submitted. (Brenas)      A - B = 2        Report signed out from the following location(s) [facility] [address] [place], [place] [address] CLIA #: [id number]  [facility] [address] [place], [place] [address] CLIA #: [id number]       Assessment & Plan:   Problem List Items Addressed This Visit   None Visit Diagnoses       Annual physical exam    -  Primary   Health maintenance reviewed during visit today.  Labs ordered.  Vaccines reviewed.  Declined colonoscopy.   Relevant Orders   TSH   PSA   Lipid panel   CBC with Differential/Platelet   Comprehensive metabolic panel with GFR     Screening for ischemic heart disease       Relevant Orders   Lipid panel  Discussed aspirin prophylaxis for myocardial infarction prevention and decision was it was not indicated  LABORATORY TESTING:  Health maintenance labs ordered today as discussed above.   The natural history of prostate cancer and ongoing controversy regarding screening and potential treatment outcomes of prostate cancer has been discussed with the patient. The meaning of a false positive PSA and a false negative PSA has been discussed. He indicates understanding of the limitations of this screening test and wishes to proceed with screening PSA testing.   IMMUNIZATIONS:   - Tdap: Tetanus vaccination status reviewed: last tetanus booster within 10 years. - Influenza: Refused - Pneumovax: Refused - Prevnar: Refused - COVID: Refused - HPV: Not applicable - Shingrix vaccine: Refused  SCREENING: - Colonoscopy: Refused   Discussed with patient purpose of the colonoscopy is to detect colon cancer at curable precancerous or early stages   - AAA Screening: Not applicable  -Hearing Test: Not applicable  -Spirometry: Not applicable   PATIENT COUNSELING:    Sexuality: Discussed sexually transmitted diseases, partner selection, use of condoms, avoidance of unintended pregnancy  and contraceptive alternatives.   Advised to avoid cigarette smoking.  I discussed with the patient that most people either abstain from alcohol or drink within safe limits (<=14/week and <=4 drinks/occasion for males, <=7/weeks and <= 3 drinks/occasion for females) and that the risk for alcohol disorders and other health effects rises proportionally with the number of drinks per week and how often a drinker exceeds daily limits.  Discussed cessation/primary prevention of drug use and availability of treatment for abuse.   Diet: Encouraged to adjust caloric intake to maintain  or achieve ideal body weight, to reduce intake of dietary saturated fat and total fat, to limit sodium intake by avoiding high sodium foods and not adding table salt, and to maintain adequate dietary potassium and calcium preferably from fresh fruits, vegetables, and low-fat dairy products.    stressed the importance of regular exercise  Injury prevention: Discussed safety belts, safety helmets, smoke detector, smoking near bedding or upholstery.   Dental health: Discussed importance of regular tooth brushing, flossing, and dental visits.   Follow up plan: NEXT PREVENTATIVE PHYSICAL DUE IN 1 YEAR. No follow-ups on file.

## 2023-11-15 ENCOUNTER — Ambulatory Visit: Payer: Self-pay | Admitting: Nurse Practitioner

## 2023-11-15 LAB — LIPID PANEL
Chol/HDL Ratio: 5.1 ratio — ABNORMAL HIGH (ref 0.0–5.0)
Cholesterol, Total: 211 mg/dL — ABNORMAL HIGH (ref 100–199)
HDL: 41 mg/dL (ref >39)
LDL Chol Calc (NIH): 143 mg/dL — ABNORMAL HIGH (ref 0–99)
Triglycerides: 147 mg/dL (ref 0–149)
VLDL Cholesterol Cal: 27 mg/dL (ref 5–40)

## 2023-11-15 LAB — CBC WITH DIFFERENTIAL/PLATELET
Basophils Absolute: 0.1 x10E3/uL (ref 0.0–0.2)
Basos: 1 %
EOS (ABSOLUTE): 0.2 x10E3/uL (ref 0.0–0.4)
Eos: 2 %
Hematocrit: 48.7 % (ref 37.5–51.0)
Hemoglobin: 16.1 g/dL (ref 13.0–17.7)
Immature Grans (Abs): 0 x10E3/uL (ref 0.0–0.1)
Immature Granulocytes: 0 %
Lymphocytes Absolute: 2.4 x10E3/uL (ref 0.7–3.1)
Lymphs: 30 %
MCH: 28.4 pg (ref 26.6–33.0)
MCHC: 33.1 g/dL (ref 31.5–35.7)
MCV: 86 fL (ref 79–97)
Monocytes Absolute: 0.6 x10E3/uL (ref 0.1–0.9)
Monocytes: 7 %
Neutrophils Absolute: 4.7 x10E3/uL (ref 1.4–7.0)
Neutrophils: 60 %
Platelets: 345 x10E3/uL (ref 150–450)
RBC: 5.67 x10E6/uL (ref 4.14–5.80)
RDW: 13.3 % (ref 11.6–15.4)
WBC: 7.9 x10E3/uL (ref 3.4–10.8)

## 2023-11-15 LAB — COMPREHENSIVE METABOLIC PANEL WITH GFR
ALT: 27 IU/L (ref 0–44)
AST: 15 IU/L (ref 0–40)
Albumin: 4.5 g/dL (ref 3.8–4.9)
Alkaline Phosphatase: 94 IU/L (ref 47–123)
BUN/Creatinine Ratio: 13 (ref 9–20)
BUN: 13 mg/dL (ref 6–24)
Bilirubin Total: 0.5 mg/dL (ref 0.0–1.2)
CO2: 26 mmol/L (ref 20–29)
Calcium: 10.1 mg/dL (ref 8.7–10.2)
Chloride: 103 mmol/L (ref 96–106)
Creatinine, Ser: 0.97 mg/dL (ref 0.76–1.27)
Globulin, Total: 2.8 g/dL (ref 1.5–4.5)
Glucose: 92 mg/dL (ref 70–99)
Potassium: 4.3 mmol/L (ref 3.5–5.2)
Sodium: 141 mmol/L (ref 134–144)
Total Protein: 7.3 g/dL (ref 6.0–8.5)
eGFR: 94 mL/min/1.73 (ref >59)

## 2023-11-15 LAB — PSA: Prostate Specific Ag, Serum: 1.6 ng/mL (ref 0.0–4.0)

## 2023-11-15 LAB — TSH: TSH: 1.61 u[IU]/mL (ref 0.450–4.500)

## 2023-11-26 ENCOUNTER — Encounter: Payer: Self-pay | Admitting: Radiology

## 2023-12-03 ENCOUNTER — Ambulatory Visit

## 2023-12-07 NOTE — Progress Notes (Deleted)
 Follow-up Evaluation: Bilateral Knee Pain     Interval History:   53 year old male seen by [facility] on 10/22/2023, 7 weeks ago.  Patient reported longstanding history of bilateral knee pain, left knee worse than right.  Bilateral knee xrays performed.  Patient diagnosed with bilateral knee osteoarthritis.  Received intra-articular corticosteroid injection into left knee.  Patient also given AAOS exercises to perform at home.   PMH/PSH/Family History/Social History/Meds/Allergies:    Past Medical History:  Diagnosis Date   Back pain    Dysplastic nevi 06/04/2013   right dorsal foot, severe atypia, excised 07/09/2013   Dysplastic nevus 06/04/2013   right thigh, right lower leg, mild atypia   Kidney stone    No past surgical history on file. Social History   Socioeconomic History   Marital status: Single    Spouse name: Not on file   Number of children: Not on file   Years of education: Not on file   Highest education level: Not on file  Occupational History   Not on file  Tobacco Use   Smoking status: Former    Current packs/day: 0.00    Average packs/day: 1 pack/day for 29.0 years (29.0 ttl pk-yrs)    Types: Cigarettes    Quit date: 01/2022    Years since quitting: 1.8   Smokeless tobacco: Never  Vaping Use   Vaping status: Never Used  Substance and Sexual Activity   Alcohol use: Yes    Comment: occassionally   Drug use: No   Sexual activity: Yes  Other Topics Concern   Not on file  Social History Narrative   Not on file   Social Drivers of Health   Financial Resource Strain: Not on file  Food Insecurity: No Food Insecurity (11/13/2022)   Hunger Vital Sign    Worried About Running Out of Food in the Last Year: Never true    Ran Out of Food in the Last Year: Never true  Transportation Needs: No Transportation Needs (11/13/2022)   PRAPARE - Administrator, Civil Service (Medical): No    Lack of Transportation  (Non-Medical): No  Physical Activity: Not on file  Stress: Not on file  Social Connections: Not on file   Family History  Problem Relation Age of Onset   Kidney cancer Mother    Basal cell carcinoma Father    Hyperlipidemia Father    No Known Allergies No current outpatient medications on file.   No current facility-administered medications for this visit.   No results found.  Review of Systems:   A ROS was performed including pertinent positives and negatives as documented in the HPI.   Musculoskeletal Exam:    There were no vitals taken for this visit.  ***  Imaging:    XR bilateral knee imaging: X-rays of the bilateral knees, 4 views including weightbearing AP, PA notch, sunrise, lateral were obtained on 09/19/2023 at [facility] facility and were available for my review.  Per my interpretation, there are no fractures or dislocations.  Mild medial joint space narrowing of the bilateral knees.  Enthesophytes noted on bilateral anterior patella surfaces.  I personally reviewed and interpreted the radiographs.   Assessment:    Bilateral knee osteoarthritis  Plan :    - ***  Medical Decision Making:  ***    I  personally saw and evaluated the patient, and participated in the management and treatment plan.   Orthopedic Surgery  This document was dictated using Dragon voice recognition software. A reasonable attempt at proof reading has been made t

## 2023-12-10 ENCOUNTER — Telehealth: Payer: Self-pay | Admitting: Physician Assistant

## 2023-12-10 ENCOUNTER — Ambulatory Visit

## 2023-12-10 DIAGNOSIS — M1711 Unilateral primary osteoarthritis, right knee: Secondary | ICD-10-CM

## 2023-12-10 DIAGNOSIS — M1712 Unilateral primary osteoarthritis, left knee: Secondary | ICD-10-CM

## 2023-12-10 NOTE — Telephone Encounter (Signed)
 Patient seen 7 weeks ago for bilateral knee pain. Had f/u scheduled for today but missed appointment. Called patient to check in.  Patient states still in pain. Minimal relief from the corticosteroid injection. States cold weather is making his knees hurt worse. Performing home exercises, but no improvement. Unsure about next steps. Interested in returning to the office to discuss. Stated he will call [facility] office to schedule a follow-up appointment (currently at work).

## 2024-11-14 ENCOUNTER — Encounter: Admitting: Nurse Practitioner
